# Patient Record
Sex: Male | Born: 1970
Health system: Southern US, Community
[De-identification: ages and names within clinical notes are randomized; demographics above are authoritative.]

## PROBLEM LIST (undated history)

## (undated) DIAGNOSIS — R42 Dizziness and giddiness: Secondary | ICD-10-CM

## (undated) DIAGNOSIS — N529 Male erectile dysfunction, unspecified: Secondary | ICD-10-CM

## (undated) DIAGNOSIS — F419 Anxiety disorder, unspecified: Secondary | ICD-10-CM

## (undated) DIAGNOSIS — R251 Tremor, unspecified: Secondary | ICD-10-CM

## (undated) DIAGNOSIS — K219 Gastro-esophageal reflux disease without esophagitis: Secondary | ICD-10-CM

## (undated) HISTORY — DX: Dizziness and giddiness: R42

## (undated) HISTORY — DX: Gastro-esophageal reflux disease without esophagitis: K21.9

## (undated) HISTORY — DX: Tremor, unspecified: R25.1

## (undated) HISTORY — DX: Anxiety disorder, unspecified: F41.9

## (undated) HISTORY — PX: UPPER GASTROINTESTINAL ENDOSCOPY: SHX188

## (undated) HISTORY — DX: Male erectile dysfunction, unspecified: N52.9

---
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
## 1988-08-09 HISTORY — PX: INGUINAL HERNIA REPAIR: SUR1180

## 2001-02-14 ENCOUNTER — Emergency Department (HOSPITAL_COMMUNITY): Admission: EM | Admit: 2001-02-14 | Discharge: 2001-02-14 | Payer: Self-pay | Admitting: Emergency Medicine

## 2001-02-20 ENCOUNTER — Ambulatory Visit (HOSPITAL_COMMUNITY): Admission: RE | Admit: 2001-02-20 | Discharge: 2001-02-20 | Payer: Self-pay | Admitting: Internal Medicine

## 2004-02-26 ENCOUNTER — Emergency Department (HOSPITAL_COMMUNITY): Admission: EM | Admit: 2004-02-26 | Discharge: 2004-02-27 | Payer: Self-pay | Admitting: Emergency Medicine

## 2005-06-14 ENCOUNTER — Emergency Department (HOSPITAL_COMMUNITY): Admission: EM | Admit: 2005-06-14 | Discharge: 2005-06-14 | Payer: Self-pay
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
## 2018-06-12 DIAGNOSIS — Z125 Encounter for screening for malignant neoplasm of prostate: Secondary | ICD-10-CM | POA: Diagnosis not present

## 2018-06-12 DIAGNOSIS — N451 Epididymitis: Secondary | ICD-10-CM | POA: Diagnosis not present

## 2018-06-12 DIAGNOSIS — Z131 Encounter for screening for diabetes mellitus: Secondary | ICD-10-CM | POA: Diagnosis not present

## 2018-06-12 DIAGNOSIS — Z113 Encounter for screening for infections with a predominantly sexual mode of transmission: Secondary | ICD-10-CM | POA: Diagnosis not present

## 2018-06-12 DIAGNOSIS — E878 Other disorders of electrolyte and fluid balance, not elsewhere classified: Secondary | ICD-10-CM | POA: Diagnosis not present

## 2018-06-12 DIAGNOSIS — R634 Abnormal weight loss: Secondary | ICD-10-CM | POA: Diagnosis not present

## 2018-06-12 DIAGNOSIS — Z13220 Encounter for screening for lipoid disorders: Secondary | ICD-10-CM | POA: Diagnosis not present

## 2018-06-12 DIAGNOSIS — N401 Enlarged prostate with lower urinary tract symptoms: Secondary | ICD-10-CM | POA: Diagnosis not present

## 2018-06-12 DIAGNOSIS — R3912 Poor urinary stream: Secondary | ICD-10-CM | POA: Diagnosis not present

## 2018-06-26 DIAGNOSIS — E291 Testicular hypofunction: Secondary | ICD-10-CM | POA: Diagnosis not present

## 2018-06-26 DIAGNOSIS — K210 Gastro-esophageal reflux disease with esophagitis: Secondary | ICD-10-CM | POA: Diagnosis not present

## 2018-06-26 DIAGNOSIS — Z0001 Encounter for general adult medical examination with abnormal findings: Secondary | ICD-10-CM | POA: Diagnosis not present

## 2018-06-26 DIAGNOSIS — E782 Mixed hyperlipidemia: Secondary | ICD-10-CM | POA: Diagnosis not present

## 2018-10-11 DIAGNOSIS — K210 Gastro-esophageal reflux disease with esophagitis: Secondary | ICD-10-CM | POA: Diagnosis not present

## 2018-10-11 DIAGNOSIS — E782 Mixed hyperlipidemia: Secondary | ICD-10-CM | POA: Diagnosis not present

## 2018-10-11 DIAGNOSIS — R7303 Prediabetes: Secondary | ICD-10-CM | POA: Diagnosis not present

## 2018-10-11 DIAGNOSIS — E291 Testicular hypofunction: Secondary | ICD-10-CM | POA: Diagnosis not present

## 2018-10-16 DIAGNOSIS — E782 Mixed hyperlipidemia: Secondary | ICD-10-CM | POA: Diagnosis not present

## 2018-10-16 DIAGNOSIS — K210 Gastro-esophageal reflux disease with esophagitis: Secondary | ICD-10-CM | POA: Diagnosis not present

## 2018-10-16 DIAGNOSIS — E291 Testicular hypofunction: Secondary | ICD-10-CM | POA: Diagnosis not present

## 2018-10-16 DIAGNOSIS — R7303 Prediabetes: Secondary | ICD-10-CM | POA: Diagnosis not present

## 2019-01-22 DIAGNOSIS — R7301 Impaired fasting glucose: Secondary | ICD-10-CM | POA: Diagnosis not present

## 2019-01-22 DIAGNOSIS — N529 Male erectile dysfunction, unspecified: Secondary | ICD-10-CM | POA: Diagnosis not present

## 2019-01-22 DIAGNOSIS — E291 Testicular hypofunction: Secondary | ICD-10-CM | POA: Diagnosis not present

## 2019-01-22 DIAGNOSIS — E782 Mixed hyperlipidemia: Secondary | ICD-10-CM | POA: Diagnosis not present

## 2019-01-22 DIAGNOSIS — R7303 Prediabetes: Secondary | ICD-10-CM | POA: Diagnosis not present

## 2019-01-29 ENCOUNTER — Ambulatory Visit (HOSPITAL_COMMUNITY)
Admission: RE | Admit: 2019-01-29 | Discharge: 2019-01-29 | Disposition: A | Discharge status: Home / Self Care | Payer: BC Managed Care – PPO | Source: Ambulatory Visit | Attending: Nurse Practitioner | Admitting: Nurse Practitioner

## 2019-01-29 ENCOUNTER — Other Ambulatory Visit (HOSPITAL_COMMUNITY): Payer: Self-pay | Admitting: Nurse Practitioner

## 2019-01-29 ENCOUNTER — Other Ambulatory Visit: Payer: Self-pay

## 2019-01-29 DIAGNOSIS — Z0001 Encounter for general adult medical examination with abnormal findings: Secondary | ICD-10-CM | POA: Diagnosis not present

## 2019-01-29 DIAGNOSIS — R7303 Prediabetes: Secondary | ICD-10-CM | POA: Diagnosis not present

## 2019-01-29 DIAGNOSIS — M25512 Pain in left shoulder: Secondary | ICD-10-CM

## 2019-01-29 DIAGNOSIS — L918 Other hypertrophic disorders of the skin: Secondary | ICD-10-CM | POA: Diagnosis not present

## 2019-01-29 DIAGNOSIS — E782 Mixed hyperlipidemia: Secondary | ICD-10-CM | POA: Diagnosis not present

## 2019-01-29 DIAGNOSIS — R7301 Impaired fasting glucose: Secondary | ICD-10-CM | POA: Diagnosis not present

## 2019-04-04 DIAGNOSIS — H6091 Unspecified otitis externa, right ear: Secondary | ICD-10-CM | POA: Diagnosis not present

## 2019-05-02 DIAGNOSIS — E782 Mixed hyperlipidemia: Secondary | ICD-10-CM | POA: Diagnosis not present

## 2019-05-02 DIAGNOSIS — R7303 Prediabetes: Secondary | ICD-10-CM | POA: Diagnosis not present

## 2019-05-02 DIAGNOSIS — R7301 Impaired fasting glucose: Secondary | ICD-10-CM | POA: Diagnosis not present

## 2019-05-10 DIAGNOSIS — R7303 Prediabetes: Secondary | ICD-10-CM | POA: Diagnosis not present

## 2019-05-10 DIAGNOSIS — E291 Testicular hypofunction: Secondary | ICD-10-CM | POA: Diagnosis not present

## 2019-05-10 DIAGNOSIS — R7301 Impaired fasting glucose: Secondary | ICD-10-CM | POA: Diagnosis not present

## 2019-05-10 DIAGNOSIS — E782 Mixed hyperlipidemia: Secondary | ICD-10-CM | POA: Diagnosis not present

## 2019-10-25 DIAGNOSIS — R42 Dizziness and giddiness: Secondary | ICD-10-CM | POA: Diagnosis not present

## 2019-10-25 DIAGNOSIS — R251 Tremor, unspecified: Secondary | ICD-10-CM | POA: Diagnosis not present

## 2019-11-08 DIAGNOSIS — R7303 Prediabetes: Secondary | ICD-10-CM | POA: Diagnosis not present

## 2019-11-08 DIAGNOSIS — Z8249 Family history of ischemic heart disease and other diseases of the circulatory system: Secondary | ICD-10-CM | POA: Diagnosis not present

## 2019-11-08 DIAGNOSIS — H6091 Unspecified otitis externa, right ear: Secondary | ICD-10-CM | POA: Diagnosis not present

## 2019-11-08 DIAGNOSIS — Z712 Person consulting for explanation of examination or test findings: Secondary | ICD-10-CM | POA: Diagnosis not present

## 2019-11-19 DIAGNOSIS — E782 Mixed hyperlipidemia: Secondary | ICD-10-CM | POA: Diagnosis not present

## 2019-11-19 DIAGNOSIS — E559 Vitamin D deficiency, unspecified: Secondary | ICD-10-CM | POA: Diagnosis not present

## 2019-11-19 DIAGNOSIS — R7301 Impaired fasting glucose: Secondary | ICD-10-CM | POA: Diagnosis not present

## 2019-11-19 DIAGNOSIS — R7303 Prediabetes: Secondary | ICD-10-CM | POA: Diagnosis not present

## 2019-12-05 ENCOUNTER — Encounter: Payer: Self-pay | Admitting: *Deleted

## 2019-12-06 ENCOUNTER — Encounter: Payer: Self-pay | Admitting: Neurology

## 2019-12-06 ENCOUNTER — Other Ambulatory Visit: Payer: Self-pay

## 2019-12-06 ENCOUNTER — Ambulatory Visit: Payer: BC Managed Care – PPO | Admitting: Neurology

## 2019-12-06 VITALS — BP 138/84 | HR 90 | Temp 97.6°F | Ht 72.0 in | Wt 164.0 lb

## 2019-12-06 DIAGNOSIS — E539 Vitamin B deficiency, unspecified: Secondary | ICD-10-CM | POA: Diagnosis not present

## 2019-12-06 DIAGNOSIS — G909 Disorder of the autonomic nervous system, unspecified: Secondary | ICD-10-CM

## 2019-12-06 DIAGNOSIS — R799 Abnormal finding of blood chemistry, unspecified: Secondary | ICD-10-CM | POA: Diagnosis not present

## 2019-12-06 DIAGNOSIS — R7989 Other specified abnormal findings of blood chemistry: Secondary | ICD-10-CM | POA: Diagnosis not present

## 2019-12-06 DIAGNOSIS — E559 Vitamin D deficiency, unspecified: Secondary | ICD-10-CM | POA: Diagnosis not present

## 2019-12-06 MED ORDER — PYRIDOSTIGMINE BROMIDE 60 MG PO TABS: 60.0000 mg | ORAL_TABLET | Freq: Three times a day (TID) | ORAL | 6 refills | Status: DC

## 2019-12-06 NOTE — Progress Notes (Signed)
PATIENT: Joshua Brennan DOB: [birth year]  Chief Complaint  Patient presents with  . Tremors    He is here to have his left hand tremor evaluated.  [name] PCP    [name], MD     HISTORICAL  Joshua Brennan is a 49 year old male, seen in request by his primary care physician Dr. [name], [name] for evaluation of dizziness, he is accompanied by his significant other is no real at today's visit on December 06, 2019.  I have reviewed and summarized the referring note from the referring physician.  He was previously healthy, works as a Transport planner, third shift for 14 years with irregular sleeping patterns, from 7 PM, to 7 AM, his job require frequent body twisting back-and-forth, occasionally heavy lifting  In 2020, he noticed gradual onset dizziness, only noticed dizziness in a standing position, especially when he first got up after prolonged sitting, he denies symptoms when lying down or sitting down, sometimes he felt so dizzy, he lost his balance, he denies significant difficulty perform his job.  He also describe worsening mild constipation, taking Viagra as needed  He denies bilateral lower extremity paresthesia, denies weakness, denies bowel and bladder incontinence, he was giving meclizine as needed, which was not helpful  On today's examination, he was found to have elevated pulse rate even resting, heart rate was 93, blood pressure 138/80, when he standing up, blood pressure was 140/28, heart rate was 103  He denied loss sense of smell, he used to be energetic only after few hours sleep, now he spent a lot of time sleeping, feels fatigued after waking up, denies REM sleep disorder  REVIEW OF SYSTEMS: Full 14 system review of systems performed and notable only for as above All other review of systems were negative.  ALLERGIES: No Known Allergies  HOME MEDICATIONS: Current Outpatient Medications  Medication Sig Dispense Refill  . meclizine (ANTIVERT) 25 MG tablet Take 25 mg by  mouth every 6 (six) hours as needed for dizziness.    . sildenafil (VIAGRA) 50 MG tablet Take 50 mg by mouth daily as needed for erectile dysfunction.     No current facility-administered medications for this visit.    PAST MEDICAL HISTORY: Past Medical History:  Diagnosis Date  . Dizziness   . Tremor     PAST SURGICAL HISTORY:   FAMILY HISTORY: No family history on file.  SOCIAL HISTORY: Social History   Socioeconomic History  . Marital status: Married    Spouse name: Not on file  . Number of children: Not on file  . Years of education: Not on file  . Highest education level: Not on file  Occupational History  . Not on file  Tobacco Use  . Smoking status: Current Every Day Smoker  Substance and Sexual Activity  . Alcohol use: Not on file  . Drug use: Not on file  . Sexual activity: Not on file  Other Topics Concern  . Not on file  Social History Narrative   Lives   Caffeine use:    Social Determinants of Health   Financial Resource Strain:   . Difficulty of Paying Living Expenses:   Food Insecurity:   . Worried About Programme researcher, broadcasting/film/video in the Last Year:   . Barista in the Last Year:   Transportation Needs:   . Freight forwarder (Medical):   [name] Lack of Transportation (Non-Medical):   Physical Activity:   . Days of Exercise per  Week:   . Minutes of Exercise per Session:   Stress:   . Feeling of Stress :   Social Connections:   . Frequency of Communication with Friends and Family:   . Frequency of Social Gatherings with Friends and Family:   . Attends Religious Services:   . Active Member of Clubs or Organizations:   . Attends Archivist Meetings:   [name] Marital Status:   Intimate Partner Violence:   . Fear of Current or Ex-Partner:   . Emotionally Abused:   [name] Physically Abused:   . Sexually Abused:      PHYSICAL EXAM   There were no vitals filed for this visit.  Not recorded     Sitting down blood pressure 138/78, heart  rate of 93, standing up 140/80, heart rate of 103 There is no height or weight on file to calculate BMI.  PHYSICAL EXAMNIATION:  Gen: NAD, conversant, well nourised, well groomed                     Cardiovascular: Regular rate rhythm, no peripheral edema, warm, nontender. Eyes: Conjunctivae clear without exudates or hemorrhage Neck: Supple, no carotid bruits. Pulmonary: Clear to auscultation bilaterally   NEUROLOGICAL EXAM:  MENTAL STATUS: Speech:    Speech is normal; fluent and spontaneous with normal comprehension.  Cognition:     Orientation to time, place and person     Normal recent and remote memory     Normal Attention span and concentration     Normal Language, naming, repeating,spontaneous speech     Fund of knowledge   CRANIAL NERVES: CN II: Visual fields are full to confrontation. Pupils are round equal and briskly reactive to light. CN III, IV, VI: extraocular movement are normal. No ptosis. CN V: Facial sensation is intact to light touch CN VII: Face is symmetric with normal eye closure  CN VIII: Hearing is normal to causal conversation. CN IX, X: Phonation is normal. CN XI: Head turning and shoulder shrug are intact  MOTOR:  Muscle bulk and he has mild bradykinesia of left upper extremity, also limited due to his left shoulder pain, and left foot tapping, muscle strength is normal.  REFLEXES: Reflexes are 2+ and symmetric at the biceps, triceps, knees, and ankles. Plantar responses are flexor.  SENSORY: Intact to light touch, pinprick and vibratory sensation are intact in fingers and toes.  COORDINATION: There is no trunk or limb dysmetria noted.  GAIT/STANCE: Posture is normal. Gait is steady with normal steps, base, arm swing, and turning. Heel and toe walking are normal. Tandem gait is normal.  Romberg is absent.   DIAGNOSTIC DATA (LABS, IMAGING, TESTING) - I reviewed patient records, labs, notes, testing and imaging myself where  available.   ASSESSMENT AND PLAN  Joshua Brennan is a 49 y.o. male   Subacute onset of positional related dizziness,   History also suggest associated mild autonomic symptoms, worsening constipation, incontinence,  There is evidence of mild orthostatic tachycardia,  Mild bradykinesia of left hand, left foot  Differentiation diagnosis include autonomic system insufficiency, early stage of central nervous system degenerative disorder.  MRI brain  Laboratory evaluations  EMG nerve conduction study  Empiric treatment with Mestinon up to 60 mg 3 times a day    Marcial Pacas, M.D. Ph.D.  [facility] [address], [place], [place] [address] Ph: [phone] Fax: [phone]  CC: Celene Squibb, MD

## 2019-12-10 ENCOUNTER — Telehealth: Payer: Self-pay | Admitting: Neurology

## 2019-12-10 LAB — COMPREHENSIVE METABOLIC PANEL
ALT: 10 IU/L (ref 0–44)
AST: 13 IU/L (ref 0–40)
Albumin/Globulin Ratio: 1.7 (ref 1.2–2.2)
Albumin: 4.3 g/dL (ref 3.8–4.9)
Alkaline Phosphatase: 121 IU/L — ABNORMAL HIGH (ref 39–117)
BUN/Creatinine Ratio: 11 (ref 9–20)
BUN: 12 mg/dL (ref 6–24)
Bilirubin Total: 0.2 mg/dL (ref 0.0–1.2)
CO2: 24 mmol/L (ref 20–29)
Calcium: 9.3 mg/dL (ref 8.7–10.2)
Chloride: 106 mmol/L (ref 96–106)
Creatinine, Ser: 1.09 mg/dL (ref 0.76–1.27)
GFR calc Af Amer: 90 mL/min/{1.73_m2} (ref >59)
GFR calc non Af Amer: 78 mL/min/{1.73_m2} (ref >59)
Globulin, Total: 2.6 g/dL (ref 1.5–4.5)
Glucose: 92 mg/dL (ref 65–99)
Potassium: 4.1 mmol/L (ref 3.5–5.2)
Sodium: 142 mmol/L (ref 134–144)
Total Protein: 6.9 g/dL (ref 6.0–8.5)

## 2019-12-10 LAB — MULTIPLE MYELOMA PANEL, SERUM
Albumin SerPl Elph-Mcnc: 3.9 g/dL (ref 2.9–4.4)
Albumin/Glob SerPl: 1.4 (ref 0.7–1.7)
Alpha 1: 0.2 g/dL (ref 0.0–0.4)
Alpha2 Glob SerPl Elph-Mcnc: 0.7 g/dL (ref 0.4–1.0)
B-Globulin SerPl Elph-Mcnc: 1.2 g/dL (ref 0.7–1.3)
Gamma Glob SerPl Elph-Mcnc: 0.9 g/dL (ref 0.4–1.8)
Globulin, Total: 3 g/dL (ref 2.2–3.9)
IgA/Immunoglobulin A, Serum: 165 mg/dL (ref 90–386)
IgG (Immunoglobin G), Serum: 1086 mg/dL (ref 603–1613)
IgM (Immunoglobulin M), Srm: 53 mg/dL (ref 20–172)

## 2019-12-10 LAB — CBC WITH DIFFERENTIAL
Basophils Absolute: 0 10*3/uL (ref 0.0–0.2)
Basos: 0 %
EOS (ABSOLUTE): 0.1 10*3/uL (ref 0.0–0.4)
Eos: 1 %
Hematocrit: 45.6 % (ref 37.5–51.0)
Hemoglobin: 15.3 g/dL (ref 13.0–17.7)
Immature Grans (Abs): 0 10*3/uL (ref 0.0–0.1)
Immature Granulocytes: 0 %
Lymphocytes Absolute: 2.3 10*3/uL (ref 0.7–3.1)
Lymphs: 25 %
MCH: 27.9 pg (ref 26.6–33.0)
MCHC: 33.6 g/dL (ref 31.5–35.7)
MCV: 83 fL (ref 79–97)
Monocytes Absolute: 0.8 10*3/uL (ref 0.1–0.9)
Monocytes: 8 %
Neutrophils Absolute: 6.1 10*3/uL (ref 1.4–7.0)
Neutrophils: 66 %
RBC: 5.49 x10E6/uL (ref 4.14–5.80)
RDW: 12.9 % (ref 11.6–15.4)
WBC: 9.3 10*3/uL (ref 3.4–10.8)

## 2019-12-10 LAB — HIV ANTIBODY (ROUTINE TESTING W REFLEX): HIV Screen 4th Generation wRfx: NONREACTIVE

## 2019-12-10 LAB — VITAMIN B12: Vitamin B-12: 595 pg/mL (ref 232–1245)

## 2019-12-10 LAB — VITAMIN D 25 HYDROXY (VIT D DEFICIENCY, FRACTURES): Vit D, 25-Hydroxy: 25.9 ng/mL — ABNORMAL LOW (ref 30.0–100.0)

## 2019-12-10 LAB — SJOGRENS SYNDROME-B EXTRACTABLE NUCLEAR ANTIBODY: ENA SSB (LA) Ab: <0.2 AI (ref 0.0–0.9)

## 2019-12-10 LAB — SEDIMENTATION RATE: Sed Rate: 3 mm/hr (ref 0–30)

## 2019-12-10 LAB — C-REACTIVE PROTEIN: CRP: <1 mg/L (ref 0–10)

## 2019-12-10 LAB — ANA W/REFLEX IF POSITIVE: Anti Nuclear Antibody (ANA): NEGATIVE

## 2019-12-10 LAB — FOLATE: Folate: 5.8 ng/mL (ref >3.0)

## 2019-12-10 LAB — THYROID PANEL WITH TSH
Free Thyroxine Index: 2.3 (ref 1.2–4.9)
T3 Uptake Ratio: 29 % (ref 24–39)
T4, Total: 8 ug/dL (ref 4.5–12.0)
TSH: 1.37 u[IU]/mL (ref 0.450–4.500)

## 2019-12-10 LAB — SJOGRENS SYNDROME-A EXTRACTABLE NUCLEAR ANTIBODY: ENA SSA (RO) Ab: <0.2 AI (ref 0.0–0.9)

## 2019-12-10 LAB — HGB A1C W/O EAG: Hgb A1c MFr Bld: 5.6 % (ref 4.8–5.6)

## 2019-12-10 LAB — CK: Total CK: 95 U/L (ref 41–331)

## 2019-12-10 LAB — RPR: RPR Ser Ql: NONREACTIVE

## 2019-12-10 NOTE — Telephone Encounter (Signed)
Please call patient, extensive laboratory evaluation showed mildly decreased vitamin D 26, he would benefit vitamin D3 supplement, 1000 units daily CMP showed mild elevated alkaline phosphate, that has unknown clinical significance, rest of the laboratory evaluations were normal.

## 2019-12-10 NOTE — Telephone Encounter (Signed)
Left message requesting a call back.

## 2019-12-11 NOTE — Telephone Encounter (Signed)
Left second message requesting a call back. 

## 2019-12-11 NOTE — Telephone Encounter (Signed)
I spoke to the patient and he verbalized understanding of his lab results. He is agreeable to start the recommended vitamin D supplement.  I was able to schedule his NCV/EMG while on the phone.

## 2019-12-13 ENCOUNTER — Telehealth: Payer: Self-pay | Admitting: Neurology

## 2019-12-13 NOTE — Telephone Encounter (Signed)
LVM for pt to call back about scheduilng mri  BC[employer] Auth: [id number] (exp. 12/12/19 to 01/10/20)

## 2019-12-14 NOTE — Telephone Encounter (Signed)
Pt returned call. Please call back when available. 

## 2019-12-17 NOTE — Telephone Encounter (Signed)
I left a voicemail for patient to call back

## 2019-12-18 NOTE — Telephone Encounter (Signed)
Pt is asking for a call back from [place]

## 2019-12-18 NOTE — Telephone Encounter (Signed)
I spoke with the patient he is scheduled at [facility] for 12/19/19.

## 2019-12-19 ENCOUNTER — Other Ambulatory Visit: Payer: Self-pay

## 2019-12-19 ENCOUNTER — Ambulatory Visit: Payer: BC Managed Care – PPO

## 2019-12-19 DIAGNOSIS — G909 Disorder of the autonomic nervous system, unspecified: Secondary | ICD-10-CM | POA: Diagnosis not present

## 2019-12-19 DIAGNOSIS — R42 Dizziness and giddiness: Secondary | ICD-10-CM | POA: Diagnosis not present

## 2019-12-19 MED ORDER — GADOBENATE DIMEGLUMINE 529 MG/ML IV SOLN
15.0000 mL | Freq: Once | INTRAVENOUS | Status: AC | PRN
  Administered 2019-12-19: 15 mL via INTRAVENOUS

## 2019-12-21 ENCOUNTER — Telehealth: Payer: Self-pay

## 2019-12-21 NOTE — Telephone Encounter (Signed)
-----   Message from Yijun Yan, MD sent at 12/21/2019  8:46 AM EDT ----- Please call pt for normal MRI brain. 

## 2019-12-21 NOTE — Telephone Encounter (Signed)
I called pt to discuss. No answer, left a message asking him to call me back. 

## 2019-12-24 ENCOUNTER — Telehealth: Payer: Self-pay | Admitting: *Deleted

## 2019-12-24 NOTE — Telephone Encounter (Signed)
I spoke to the patient and notified him of the MRI results below.

## 2019-12-24 NOTE — Telephone Encounter (Signed)
-----   Message from Levert Feinstein, MD sent at 12/21/2019  8:46 AM EDT ----- Please call pt for normal MRI brain.

## 2020-01-02 ENCOUNTER — Telehealth: Payer: Self-pay | Admitting: Neurology

## 2020-01-02 ENCOUNTER — Ambulatory Visit: Payer: BC Managed Care – PPO | Admitting: Neurology

## 2020-01-02 ENCOUNTER — Other Ambulatory Visit: Payer: Self-pay

## 2020-01-02 ENCOUNTER — Ambulatory Visit (INDEPENDENT_AMBULATORY_CARE_PROVIDER_SITE_OTHER): Payer: BC Managed Care – PPO | Admitting: Neurology

## 2020-01-02 DIAGNOSIS — G909 Disorder of the autonomic nervous system, unspecified: Secondary | ICD-10-CM | POA: Diagnosis not present

## 2020-01-02 DIAGNOSIS — R202 Paresthesia of skin: Secondary | ICD-10-CM | POA: Diagnosis not present

## 2020-01-02 DIAGNOSIS — G5622 Lesion of ulnar nerve, left upper limb: Secondary | ICD-10-CM

## 2020-01-02 DIAGNOSIS — F329 Major depressive disorder, single episode, unspecified: Secondary | ICD-10-CM | POA: Diagnosis not present

## 2020-01-02 DIAGNOSIS — F32A Depression, unspecified: Secondary | ICD-10-CM

## 2020-01-02 NOTE — Progress Notes (Signed)
PATIENT: Joshua Brennan DOB: 01/10/1971  No chief complaint on file.    HISTORICAL  Joshua Brennan is a 49 year old male, seen in request by his primary care physician Dr. Nevada Crane, Jenny Reichmann for evaluation of dizziness, he is accompanied by his significant others at today's visit on December 06, 2019.  I have reviewed and summarized the referring note from the referring physician.  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  He also describe worsening mild constipation, taking Viagra as needed  He denies bilateral lower extremity paresthesia, denies weakness, denies bowel and bladder incontinence, he was giving meclizine as needed, which was not helpful  On today's examination, he was found to have elevated pulse rate even resting, heart rate was 93, blood pressure 138/80, when he standing up, blood pressure was 140/28, heart rate was 103  He denied loss sense of smell, he used to be energetic only after few hours sleep, now he spent a lot of time sleeping, feels fatigued after waking up, denies REM sleep disorder  UPDATE Jan 02 2020: He return for electrodiagnostic study today, which showed mild left ulnar neuropathy, no evidence of large fiber peripheral neuropathy, or left cervical radiculopathy.  He also complains of fatigue, lack of energy, both himself and his significant can others confirm that his suffering depression,  He has tried Mestinon, complains of bloating, did not take this morning,  Lying down blood pressure  119/74, heart rate of 69, sitting up 144/75 heart rate of 70, standing up 132/84 heart rate of 85, standing up for 1 minutes 120/82 heart rate of 90  I personally reviewed MRI of the brain with without contrast on 12/22/2019 that was normal  Extensive laboratory evaluation on 12/06/2019 showed normal A1c 5.6, B12 595, C-reactive protein, ESR, CPK, thyroid functional test, ANA, SSA, B, immunofixation protein electrophoresis, CBC, negative RPR, CMP, HIV, mildly low vitamin D 26,  REVIEW OF SYSTEMS: Full 14 system review of systems performed and notable only for as above All other review of systems were negative.  ALLERGIES: No Known Allergies  HOME MEDICATIONS: Current Outpatient Medications  Medication Sig Dispense Refill  . Cholecalciferol (VITAMIN D3 PO) Take 1,000 Units by mouth daily.    [name] ibuprofen (ADVIL) 600 MG tablet Take 600 mg by mouth 4 (four) times daily as needed.    . meclizine (ANTIVERT) 25 MG tablet Take 25 mg by mouth every 6 (six) hours as needed for dizziness.    . pyridostigmine (MESTINON) 60 MG tablet Take 1 tablet (60 mg total) by mouth 3 (three) times daily. 90 tablet 6  . sildenafil (VIAGRA) 50 MG tablet Take 50 mg by mouth daily as needed for erectile dysfunction.     No current facility-administered medications for this visit.    PAST MEDICAL HISTORY: Past Medical History:  Diagnosis Date  . Anxiety   . Dizziness   . Tremor     PAST SURGICAL HISTORY:   FAMILY HISTORY: Family History  Problem Relation Age of Onset  . Diabetes Mother   . Pneumonia Father  SOCIAL HISTORY: Social History   Socioeconomic History  . Marital status: Married    Spouse name: Not on file  . Number of children: 2  . Years of education: some college  . Highest education level: Not on file  Occupational History  . Occupation: Designer, fashion/clothing  Tobacco Use  . Smoking status: Current Every Day Smoker    Packs/day: 1.00    Types: Cigarettes  . Smokeless tobacco: Never  Used  Substance and Sexual Activity  . Alcohol use: Not Currently  . Drug use: Yes    Types: Marijuana    Comment: sometimes  . Sexual activity: Not on file  Other Topics Concern  . Not on file  Social History Narrative   Left handed.   Lives at home with significant other.   He drinks 8-10 cans of double espresso cold coffee each week.   Social Determinants of Health   Financial Resource Strain:   . Difficulty of Paying Living Expenses:   Food Insecurity:   . Worried About Charity fundraiser in the Last Year:   . Arboriculturist in the Last Year:   Transportation Needs:   . Film/video editor (Medical):   [name] Lack of Transportation (Non-Medical):   Physical Activity:   . Days of Exercise per Week:   . Minutes of Exercise per Session:   Stress:   . Feeling of Stress :   Social Connections:   . Frequency of Communication with Friends and Family:   . Frequency of Social Gatherings with Friends and Family:   . Attends Religious Services:   . Active Member of Clubs or Organizations:   . Attends Archivist Meetings:   [name] Marital Status:   Intimate Partner Violence:   . Fear of Current or Ex-Partner:   . Emotionally Abused:   [name] Physically Abused:   . Sexually Abused:      PHYSICAL EXAM   There were no vitals filed for this visit.  Not recorded     Sitting down blood pressure 138/78, heart rate of 93, standing up 140/80, heart rate of 103 There is no height or weight on file to calculate BMI.  PHYSICAL EXAMNIATION:  Gen: NAD, conversant, well nourised, well groomed                     Cardiovascular: Regular rate rhythm, no peripheral edema, warm, nontender. Eyes: Conjunctivae clear without exudates or hemorrhage Neck: Supple, no carotid bruits. Pulmonary: Clear to auscultation bilaterally   NEUROLOGICAL EXAM:  MENTAL STATUS: Speech:    Speech is normal; fluent and spontaneous with normal comprehension.  Cognition:     Orientation to time, place  and person     Normal recent and remote memory     Normal Attention span and concentration     Normal Language, naming, repeating,spontaneous speech     Fund of knowledge   CRANIAL NERVES: CN II: Visual fields are full to confrontation. Pupils are round equal and briskly reactive to light. CN III, IV, VI: extraocular movement are normal. No ptosis. CN V: Facial sensation is intact to light touch CN VII: Face is symmetric with normal eye closure  CN VIII: Hearing is normal to causal conversation. CN IX, X: Phonation is normal. CN XI: Head turning and shoulder shrug are intact  MOTOR: Limitation of left shoulder examination due to left shoulder pain, there was no weakness of bilateral upper extremity or lower extremity noted, there was no  bradykinesia noted.  REFLEXES: Reflexes are 2+ and symmetric at the biceps, triceps, knees, and ankles. Plantar responses are flexor.  SENSORY: Intact to light touch, pinprick and vibratory sensation are intact in fingers and toes.  COORDINATION: There is no trunk or limb dysmetria noted.  GAIT/STANCE: He can get up from seated position arms crossed, steady  DIAGNOSTIC DATA (LABS, IMAGING, TESTING) - I reviewed patient records, labs, notes, testing and imaging myself where available.   ASSESSMENT AND PLAN  Joshua Brennan is a 49 y.o. male   Constellation of complaints,  Including poor sleep quality, dizziness, fatigue,  Himself and his significant others to realize that he is suffering depression,  Extensive neurological evaluation showed normal MRI of the brain, extensive laboratory evaluation only showed mild vitamin D deficiency,  He will continue follow-up with his primary care for treatment of depression  Left ulnar neuropathy  Mild, demyelinating, left elbow sensitivity,    Marcial Pacas, M.D. Ph.D.  [facility] [address] [place], [place] [address] Ph: [phone] Fax: [phone]  CC: Celene Squibb, MD

## 2020-01-02 NOTE — Telephone Encounter (Signed)
Open in error

## 2020-01-02 NOTE — Procedures (Signed)
Full Name: Joshua Brennan Gender: Male MRN #: [id number] Date of Birth: 1971/05/21    Visit Date: 01/02/2020 09:13 Age: 49 Years Examining Physician: Levert Feinstein, MD  Referring Physician: Levert Feinstein, MD Height: 6 feet 0 inch History:   49 year old male complains of fatigue, left shoulder pain, left hand paresthesia mainly involving left fourth and fifth fingers.  Summary of the tests:  Nerve conduction study: Left ulnar sensory response showed mildly decreased snap amplitude, left ulnar motor responses showed 12 m/s velocity drop across left elbow. Left sural, superficial peroneal, median, radial sensory responses were normal.  Left tibial, peroneal to EDB, median motor responses were normal  Electromyography: Selected needle examination of left lower extremity muscles, left upper extremity muscles and left cervical paraspinal muscles showed no significant abnormality, he has mild decreased recruitment patterns noted at the left abductor digital minimum.  Conclusion: This is a mild abnormal study.  There is electrodiagnostic evidence of mild left ulnar neuropathy across the left elbow, demyelinating, no evidence of axonal loss.  There is no evidence of large fiber peripheral neuropathy, left cervical radiculopathy.    ------------------------------- Levert Feinstein, M.D. PhD  [facility] [address], [address] [place], [place] [address] Tel: [phone] Fax: [phone]  Verbal informed consent was obtained from the patient, patient was informed of potential risk of procedure, including bruising, bleeding, hematoma formation, infection, muscle weakness, muscle pain, numbness, among others.        MNC    Nerve / Sites Muscle Latency Ref. Amplitude Ref. Rel Amp Segments Distance Velocity Ref. Area    ms ms mV mV %  cm m/s m/s mVms  L Median - APB     Wrist APB 3.7 ?4.4 5.6 ?4.0 100 Wrist - APB 7   18.8     Upper arm APB 8.1  5.2  91.7 Upper arm - Wrist 25  56 ?49 19.2  L Ulnar - ADM     Wrist ADM 2.8 ?3.3 8.0 ?6.0 100 Wrist - ADM 7   32.9     B.Elbow ADM 7.8  7.6  94.7 B.Elbow - Wrist 25 51 ?49 32.2     A.Elbow ADM 10.3  7.0  92.9 A.Elbow - B.Elbow 10 39 ?49 31.9  L Peroneal - EDB     Ankle EDB 5.4 ?6.5 5.0 ?2.0 100 Ankle - EDB 9   14.8     Fib head EDB 13.0  4.5  91 Fib head - Ankle 33 44 ?44 13.9     Pop fossa EDB 15.3  4.4  97.3 Pop fossa - Fib head 10 44 ?44 13.7         Pop fossa - Ankle      L Tibial - AH     Ankle AH 4.2 ?5.8 10.9 ?4.0 100 Ankle - AH 9   20.2     Pop fossa AH 15.2  8.2  75.4 Pop fossa - Ankle 45 41 ?41 20.3             SNC    Nerve / Sites Rec. Site Peak Lat Ref.  Amp Ref. Segments Distance    ms ms V V  cm  L Radial - Anatomical snuff box (Forearm)     Forearm Wrist 2.5 ?2.9 38 ?15 Forearm - Wrist 10  L Sural - Ankle (Calf)     Calf Ankle 3.6 ?4.4 24 ?6 Calf - Ankle 14  L Superficial peroneal - Ankle  Lat leg Ankle 4.3 ?4.4 12 ?6 Lat leg - Ankle 14  L Median - Orthodromic (Dig II, Mid palm)     Dig II Wrist 2.9 ?3.4 12 ?10 Dig II - Wrist 13  L Ulnar - Orthodromic, (Dig V, Mid palm)     Dig V Wrist 2.8 ?3.1 2 ?5 Dig V - Wrist 1               F  Wave    Nerve F Lat Ref.   ms ms  L Tibial - AH 58.5 ?56.0  L Ulnar - ADM 34.8 ?32.0         EMG Summary Table    Spontaneous MUAP Recruitment  Muscle IA Fib PSW Fasc Other Amp Dur. Poly Pattern  L. First dorsal interosseous Normal None None None _______ Normal Normal Normal Normal  L. Abductor pollicis brevis Normal None None None _______ Normal Normal Normal Reduced  L. Flexor digitorum profundus (Ulnar) Normal None None None _______ Normal Normal Normal Normal  L. Pronator teres Normal None None None _______ Normal Normal Normal Normal  L. Biceps brachii Normal None None None _______ Normal Normal Normal Normal  L. Deltoid Normal None None None _______ Normal Normal Normal Normal  L. Triceps brachii Normal None None None _______ Normal Normal Normal  Normal  L. Cervical paraspinals Normal None None None _______ Normal Normal Normal Normal  L. Tibialis anterior Normal None None None _______ Normal Normal Normal Normal  L. Tibialis posterior Normal None None None _______ Normal Normal Normal Normal  L. Peroneus longus Normal None None None _______ Normal Normal Normal Normal  L. Gastrocnemius (Medial head) Normal None None None _______ Normal Normal Normal Normal  L. Vastus lateralis Normal None None None _______ Normal Normal Normal Normal

## 2020-02-19 DIAGNOSIS — R42 Dizziness and giddiness: Secondary | ICD-10-CM | POA: Diagnosis not present

## 2020-02-19 DIAGNOSIS — R251 Tremor, unspecified: Secondary | ICD-10-CM | POA: Diagnosis not present

## 2020-02-19 DIAGNOSIS — R7303 Prediabetes: Secondary | ICD-10-CM | POA: Diagnosis not present

## 2020-02-19 DIAGNOSIS — K920 Hematemesis: Secondary | ICD-10-CM | POA: Diagnosis not present

## 2020-02-19 DIAGNOSIS — R110 Nausea: Secondary | ICD-10-CM | POA: Diagnosis not present

## 2020-02-19 DIAGNOSIS — H6091 Unspecified otitis externa, right ear: Secondary | ICD-10-CM | POA: Diagnosis not present

## 2020-02-26 DIAGNOSIS — H8110 Benign paroxysmal vertigo, unspecified ear: Secondary | ICD-10-CM | POA: Diagnosis not present

## 2020-02-26 DIAGNOSIS — K920 Hematemesis: Secondary | ICD-10-CM | POA: Diagnosis not present

## 2020-02-26 DIAGNOSIS — R42 Dizziness and giddiness: Secondary | ICD-10-CM | POA: Diagnosis not present

## 2020-02-26 DIAGNOSIS — R110 Nausea: Secondary | ICD-10-CM | POA: Diagnosis not present

## 2020-02-28 ENCOUNTER — Encounter: Payer: Self-pay | Admitting: Nurse Practitioner

## 2020-03-20 DIAGNOSIS — K920 Hematemesis: Secondary | ICD-10-CM | POA: Diagnosis not present

## 2020-03-20 DIAGNOSIS — R110 Nausea: Secondary | ICD-10-CM | POA: Diagnosis not present

## 2020-03-20 DIAGNOSIS — R42 Dizziness and giddiness: Secondary | ICD-10-CM | POA: Diagnosis not present

## 2020-03-20 DIAGNOSIS — H8110 Benign paroxysmal vertigo, unspecified ear: Secondary | ICD-10-CM | POA: Diagnosis not present

## 2020-03-25 DIAGNOSIS — M25512 Pain in left shoulder: Secondary | ICD-10-CM | POA: Diagnosis not present

## 2020-03-28 DIAGNOSIS — M25512 Pain in left shoulder: Secondary | ICD-10-CM | POA: Diagnosis not present

## 2020-03-31 NOTE — Progress Notes (Signed)
03/31/2020 Joshua Brennan [id number] [birth year]   CHIEF COMPLAINT: Hematemesis   HISTORY OF PRESENT ILLNESS:  Joshua Brennan is a 49 year old male with a past medical history of anxiety, vertigo, left hand tremors secondary to left ulnar neuropathy. Right knee arthroscopy age 62. Left inguinal hernia surgery age 24.  He was referred to our office by Dr.John Nevada Crane for further evaluation regarding hematemesis. He developed stomach discomfort with nausea on 02/19/2020 while working the night shift. He ate Jell-O with fruit earlier in his shift. He developed stomach pain, worsening nausea and vomited up the Jell-O and fruit he ate earlier. He vomited a second time which consisted of a moderate amount of dark red blood. He vomited a third time which consisted of a small amount of dark red blood. He described feeling as if his stomach was in overdrive. Since that time, no further vomiting but his nausea continues. He reports having nausea and dizziness which started April or May 2021. He was seen by neurology and a brain MRI 12/2019 was reported as normal. He was assessed to most likely have vertigo and he was referred to ENT Dr. Benjamine Mola, his consult is scheduled on 04/17/2020. He is taking Meclizine with symptom relief. He reports being diagnosed with acid reflux more than 5 years ago. He thinks he may have had an EGD 5 years ago but he is not sure, he does not remember. He took AcipHex for a brief period of time but it caused nausea so he stopped taking it. He has intermittent heartburn, sometimes for a week then gone for several weeks. No recent dysphagia. He takes baking soda infrequently for stomach upset. He takes Aleve 2 tabs once daily as needed for generalized aches and pains, approximately 15 days monthly for the past year. He tarted taking Celebrex 200 mg 1 p.o. twice daily approximately 1 week ago due to having a torn left rotator cuff. No other NSAIDs. He denies any history of heavy alcohol use. He  stopped drinking all alcohol 09/2019. He smokes cigarettes less than 1 pack on and off since he was 49 years old. He has lost 14 pounds over the past 3 to 4 months. He stated his usual weight was 180 pounds. His weight in our office today is 166 pounds. No fever, sweats or chills. His appetite has decreased. He has intermittent upper abdominal discomfort and bloat when eating. No specific food triggers. He previously passed a normal formed brown bowel movement once every day or every other day. However, for the past 6 weeks he has been constipated. He is passing a small or brown stool every 3 to 4 days. He saw a small amount of red blood on the toilet tissue and on the stool on 1 occasion several weeks ago. No melena. No rectal pain. No lower abdominal pain. No known family history of esophageal, gastric or colon cancer. He has not received the Covid vaccination.  Labs done 02/19/2020 showed WBC 10.4. Hemoglobin 15.9. Hematocrit 48.2. Platelet 260. Calcium 10.3. Alk phos 125. AST 14. ALT 12. Total bili 2.0. BUN 19. Creatinine 1.19.   CBC Latest Ref Rng & Units 12/06/2019  WBC 3.4 - 10.8 x10E3/uL 9.3  Hemoglobin 13.0 - 17.7 g/dL 15.3  Hematocrit 37.5 - 51.0 % 45.6    CMP Latest Ref Rng & Units 12/06/2019  Glucose 65 - 99 mg/dL 92  BUN 6 - 24 mg/dL 12  Creatinine 0.76 - 1.27 mg/dL 1.09  Sodium 134 -  144 mmol/L 142  Potassium 3.5 - 5.2 mmol/L 4.1  Chloride 96 - 106 mmol/L 106  CO2 20 - 29 mmol/L 24  Calcium 8.7 - 10.2 mg/dL 9.3  Total Protein 6.0 - 8.5 g/dL 6.9  Total Bilirubin 0.0 - 1.2 mg/dL 0.2  Alkaline Phos 39 - 117 IU/L 121(H)  AST 0 - 40 IU/L 13  ALT 0 - 44 IU/L 10    Past Medical History:  Diagnosis Date  . Anxiety   . Dizziness   . Tremor     Social History:  [employer], off work since 02/19/2020 due to vertigo. He smokes cigarettes almost 1 ppd since age 36. No alcohol since 09/15/2019. Past marijuana use, last used 4 weeks ago.   Family History:  Mother died age 2 from  diabetes complications. Father deceased, history unknown. Sister age 31.  Maternal grandmother with diabetes.   No Known Allergies    Outpatient Encounter Medications as of 04/02/2020  Medication Sig  . ibuprofen (ADVIL) 600 MG tablet Take 600 mg by mouth 4 (four) times daily as needed.  . meclizine (ANTIVERT) 25 MG tablet Take 25 mg by mouth 3 (three) times daily as needed for dizziness.  . sildenafil (VIAGRA) 50 MG tablet Take 50 mg by mouth daily as needed for erectile dysfunction.   No facility-administered encounter medications on file as of 04/02/2020.     REVIEW OF SYSTEMS:   Gen: 14 pound weight loss. No fever, sweats or chills. CV: Denies chest pain, palpitations or edema. Resp: Denies cough, shortness of breath of hemoptysis.  GI: See HPI. GU : Weak urinary stream, forces urination. No hematuria. MS: Denies joint pain, muscles aches or weakness. Derm: Denies rash, itchiness, skin lesions or unhealing ulcers. Psych: + anxiety, dizziness. Depression or suicidal ideation. Heme: Denies bruising, bleeding. Neuro:  Dizziness.  Endo:  Denies any problems with DM, thyroid or adrenal function.    PHYSICAL EXAM: BP 120/80 (BP Location: Left Arm, Patient Position: Sitting, Cuff Size: Normal)   Pulse (!) 108   Ht _0  (1.803 m) Comment: height measured without shoes  Wt 166 lb 4 oz (75.4 kg)   BMI 23.19 kg/m   General: Anxious appearing 49 year old male in no acute distress. Head: Normocephalic and atraumatic. Eyes:  Sclerae non-icteric, conjunctive pink. Ears: Normal auditory acuity. Mouth: Dentition intact. Several gold fillings. No ulcers or lesions.  Neck: Supple, no lymphadenopathy or thyromegaly.  Lungs: Clear bilaterally to auscultation without wheezes, crackles or rhonchi. Heart: Tachycardic. No murmur, rub or gallop appreciated.  Abdomen: Soft, nontender, non distended. No masses. No hepatosplenomegaly. Normoactive bowel sounds x 4 quadrants.  Rectal:  Deferred. Musculoskeletal: Symmetrical with no gross deformities. Skin: Warm and dry. No rash or lesions on visible extremities. Extremities: No edema. Neurological: Alert oriented x 4, no focal deficits.  Psychological:  Alert and cooperative. Normal mood and affect.  ASSESSMENT AND PLAN:  13. 49 year old male with a history of GERD with hematemesis x 2 episodes on 02/19/2020 without recurrence. -EGD to rule out GERD/PUD/upper GI malignancy. EGD benefits and risks discussed including risk with sedation, risk of bleeding, perforation and infection  -Famotidine 20 mg 1 p.o. daily for now (PPI deferred for now as patient did not previously tolerate Aciphex), will most likely require PPI -Decrease Celebrex to once daily, avoid all other NSAIDs -Further recommendations to be determined after EGD completed  2. Rectal bleeding x 1 episode. Never had a screening colonoscopy.  -Colonoscopy benefits and risks discussed including risk with sedation,  risk of bleeding, perforation and infection    3. Mildly elevated alk phos level with normal AST/ALT levels. Calcium level  mildly elevated.  -Hepatic panel, GGT -See plan in #4  4. Weight loss -I would recommend a chest/abdominal/pelvic CT with contrast. He is anxious at this time, he will proceed with the EGD and colonoscopy next week. I discussed scheduling a chest/abdominal/pelvic CT after he completes his EGD and colonoscopy.  5. Constipation -MiraLAX nightly as needed as tolerated  6. Decreased urinary flow -Follow up with Dr. Nevada Crane for urology consult  7. Vertigo. Negative brain MRI. ENT consult scheduled 04/17/2020.     CC:  Celene Squibb, MD

## 2020-04-01 DIAGNOSIS — M25512 Pain in left shoulder: Secondary | ICD-10-CM | POA: Diagnosis not present

## 2020-04-02 ENCOUNTER — Other Ambulatory Visit (INDEPENDENT_AMBULATORY_CARE_PROVIDER_SITE_OTHER): Payer: BC Managed Care – PPO

## 2020-04-02 ENCOUNTER — Encounter: Payer: Self-pay | Admitting: Nurse Practitioner

## 2020-04-02 ENCOUNTER — Ambulatory Visit: Payer: BC Managed Care – PPO | Admitting: Nurse Practitioner

## 2020-04-02 VITALS — BP 120/80 | HR 108 | Ht 71.0 in | Wt 166.2 lb

## 2020-04-02 DIAGNOSIS — K219 Gastro-esophageal reflux disease without esophagitis: Secondary | ICD-10-CM | POA: Diagnosis not present

## 2020-04-02 DIAGNOSIS — R634 Abnormal weight loss: Secondary | ICD-10-CM | POA: Insufficient documentation

## 2020-04-02 DIAGNOSIS — K920 Hematemesis: Secondary | ICD-10-CM | POA: Diagnosis not present

## 2020-04-02 DIAGNOSIS — K5900 Constipation, unspecified: Secondary | ICD-10-CM

## 2020-04-02 DIAGNOSIS — K625 Hemorrhage of anus and rectum: Secondary | ICD-10-CM

## 2020-04-02 DIAGNOSIS — Z01818 Encounter for other preprocedural examination: Secondary | ICD-10-CM

## 2020-04-02 LAB — BASIC METABOLIC PANEL
BUN: 18 mg/dL (ref 6–23)
CO2: 25 mEq/L (ref 19–32)
Calcium: 9.8 mg/dL (ref 8.4–10.5)
Chloride: 110 mEq/L (ref 96–112)
Creatinine, Ser: 1.1 mg/dL (ref 0.40–1.50)
GFR: 85.21 mL/min (ref >60.00)
Glucose, Bld: 107 mg/dL — ABNORMAL HIGH (ref 70–99)
Potassium: 4.1 mEq/L (ref 3.5–5.1)
Sodium: 143 mEq/L (ref 135–145)

## 2020-04-02 LAB — HEPATIC FUNCTION PANEL
ALT: 10 U/L (ref 0–53)
AST: 11 U/L (ref 0–37)
Albumin: 4.6 g/dL (ref 3.5–5.2)
Alkaline Phosphatase: 86 U/L (ref 39–117)
Bilirubin, Direct: 0 mg/dL (ref 0.0–0.3)
Total Bilirubin: 0.3 mg/dL (ref 0.2–1.2)
Total Protein: 7.8 g/dL (ref 6.0–8.3)

## 2020-04-02 LAB — CBC WITH DIFFERENTIAL/PLATELET
Basophils Absolute: 0 10*3/uL (ref 0.0–0.1)
Basophils Relative: 0.3 % (ref 0.0–3.0)
Eosinophils Absolute: 0.2 10*3/uL (ref 0.0–0.7)
Eosinophils Relative: 2.2 % (ref 0.0–5.0)
HCT: 46.4 % (ref 39.0–52.0)
Hemoglobin: 15.5 g/dL (ref 13.0–17.0)
Lymphocytes Relative: 29.1 % (ref 12.0–46.0)
Lymphs Abs: 2.5 10*3/uL (ref 0.7–4.0)
MCHC: 33.5 g/dL (ref 30.0–36.0)
MCV: 82.9 fl (ref 78.0–100.0)
Monocytes Absolute: 0.9 10*3/uL (ref 0.1–1.0)
Monocytes Relative: 9.8 % (ref 3.0–12.0)
Neutro Abs: 5.1 10*3/uL (ref 1.4–7.7)
Neutrophils Relative %: 58.6 % (ref 43.0–77.0)
Platelets: 235 10*3/uL (ref 150.0–400.0)
RBC: 5.6 Mil/uL (ref 4.22–5.81)
RDW: 13.3 % (ref 11.5–15.5)
WBC: 8.7 10*3/uL (ref 4.0–10.5)

## 2020-04-02 LAB — GAMMA GT: GGT: 24 U/L (ref 7–51)

## 2020-04-02 MED ORDER — POLYETHYLENE GLYCOL 3350 17 G PO PACK: 17.0000 g | PACK | Freq: Every evening | ORAL | 0 refills | Status: DC | PRN

## 2020-04-02 MED ORDER — FAMOTIDINE 20 MG PO TABS: 20.0000 mg | ORAL_TABLET | Freq: Every day | ORAL | 1 refills | Status: DC

## 2020-04-02 MED ORDER — SUPREP BOWEL PREP KIT 17.5-3.13-1.6 GM/177ML PO SOLN: ORAL | 0 refills | Status: DC

## 2020-04-02 NOTE — Patient Instructions (Addendum)
If you are age 51 or older, your body mass index should be between 23-30. Your Body mass index is 23.19 kg/m. If this is out of the aforementioned range listed, please consider follow up with your Primary Care Provider.  If you are age 32 or younger, your body mass index should be between 19-25. Your Body mass index is 23.19 kg/m. If this is out of the aformentioned range listed, please consider follow up with your Primary Care Provider.   You have been scheduled for an endoscopy and colonoscopy. Please follow the written instructions given to you at your visit today. Please pick up your prep supplies at the pharmacy within the next 1-3 days. If you use inhalers (even only as needed), please bring them with you on the day of your procedure.  Please go to the lab in the basement of our building to have lab work done as you leave today. Hit "B" for basement when you get on the elevator.  When the doors open the lab is on your left.  We will call you with the results. Thank you.  Due to recent changes in healthcare laws, you may see the results of your imaging and laboratory studies on MyChart before your provider has had a chance to review them.  We understand that in some cases there may be results that are confusing or concerning to you. Not all laboratory results come back in the same time frame and the provider may be waiting for multiple results in order to interpret others.  Please give [place] 48 hours in order for your provider to thoroughly review all the results before contacting the office for clarification of your results.   We have sent the following medications to your pharmacy for you to pick up at your convenience: Pepcid 20 mg: Take once daily  Please purchase the following medications over the counter and take as directed: Miralax: Take daily at bedtime for constipation  Decrease Celebrex to once daily for now.  Call the office if your symptoms worsen.  Thank you for entrusting me  with your care and for choosing [facility]

## 2020-04-02 NOTE — Progress Notes (Signed)
I agree with the above note, plan 

## 2020-04-05 LAB — ALKALINE PHOSPHATASE, ISOENZYMES
Alkaline Phosphatase: 106 IU/L (ref 48–121)
BONE FRACTION: 26 % (ref 12–68)
INTESTINAL FRAC.: 0 % (ref 0–18)
LIVER FRACTION: 74 % (ref 13–88)

## 2020-04-08 ENCOUNTER — Encounter: Payer: BC Managed Care – PPO | Admitting: Gastroenterology

## 2020-04-16 ENCOUNTER — Encounter (HOSPITAL_COMMUNITY): Payer: Self-pay | Admitting: Physical Therapy

## 2020-04-16 ENCOUNTER — Ambulatory Visit (HOSPITAL_COMMUNITY): Payer: BC Managed Care – PPO | Attending: Internal Medicine | Admitting: Physical Therapy

## 2020-04-16 ENCOUNTER — Other Ambulatory Visit: Payer: Self-pay

## 2020-04-16 DIAGNOSIS — R42 Dizziness and giddiness: Secondary | ICD-10-CM | POA: Diagnosis not present

## 2020-04-16 NOTE — Therapy (Signed)
[facility] [address] [place], [place], [address] Phone: [phone]   Fax:  [phone]  Physical Therapy Evaluation  Patient Details  Name: Joshua Brennan MRN: [id number] Date of Birth: [birth year] Referring Provider (PT): [name] MD    Encounter Date: 04/16/2020   PT End of Session - 04/16/20 1213    Visit Number 1    Number of Visits 4    Date for PT Re-Evaluation 05/16/20    Authorization Type BCBS COMM PPO    PT Start Time 1120    PT Stop Time 1205    PT Time Calculation (min) 45 min    Activity Tolerance Patient tolerated treatment well    Behavior During Therapy [facility] for tasks assessed/performed           Past Medical History:  Diagnosis Date  . Anxiety   . Dizziness   . ED (erectile dysfunction)   . Tremor   . Vertigo     Past Surgical History:  Procedure Laterality Date  . INGUINAL HERNIA REPAIR  1990  . KNEE ARTHROSCOPY Right 1987    There were no vitals filed for this visit.    Subjective Assessment - 04/16/20 1129    Subjective Patient presents to physical therapy with complaint of dizziness and balance difficulty. Patient says this began sometime in July. Says he gets dizzy when he stands up or gets out of bed. Had recent MRI which was unremarkable. Says he gets dizzy when getting up and makes him feel that he is stumbling around when he walks. Says duration of symptoms depends, sometimes a few minutes sometimes all day, depends on what he is doing. Patient says he was recently prescribing Meclizine which helps a little.    Limitations Standing;Walking;House hold activities    Diagnostic tests MRI    Patient Stated Goals get rid of dizziness    Currently in Pain? No/denies              [facility] PT Assessment - 04/16/20 0001      Assessment   Medical Diagnosis Vertigo     Referring Provider (PT) [name] MD     Onset Date/Surgical Date --   July 2021   Prior Therapy No       Precautions    Precautions None      Restrictions   Weight Bearing Restrictions No      Balance Screen   Has the patient fallen in the past 6 months Yes    How many times? 1    Has the patient had a decrease in activity level because of a fear of falling?  Yes    Is the patient reluctant to leave their home because of a fear of falling?  Yes      Home Environment   Living Environment Private residence    Living Arrangements Non-relatives/Friends      Prior Function   Level of Independence Independent    Vocation Full time employment;Other (comment)   FMLA Transport planner at [employer]   Overall Cognitive Status Within Functional Limits for tasks assessed      Observation/Other Assessments   Focus on Therapeutic Outcomes (FOTO)  46% limited                   Vestibular Assessment - 04/16/20 0001      Symptom Behavior   Type of Dizziness  Spinning  Frequency of Dizziness at least 3-4 x daily     Duration of Dizziness varies     Symptom Nature Motion provoked;Positional    Aggravating Factors Sit to stand;Supine to sit;Rolling to right;Rolling to left;Mornings;Turning head quickly;Turning body quickly    Relieving Factors Head stationary    Progression of Symptoms No change since onset      Oculomotor Exam   Ocular ROM WNL    Smooth Pursuits Intact    Saccades Intact   slight dizziness     Vestibulo-Ocular Reflex   VOR 1 Head Only (x 1 viewing) Overshoot toward RT x 1, otherwise WNL       Positional Testing   Dix-Hallpike Dix-Hallpike Right;Dix-Hallpike Left      Dix-Hallpike Right   Dix-Hallpike Right Symptoms No nystagmus   dizziness lasting 2-3 seconds with rolling LT      Dix-Hallpike Left   Dix-Hallpike Left Symptoms --   Negative     Positional Sensitivities   Sit to Supine Mild dizziness              Objective measurements completed on examination: See above findings.               PT Education - 04/16/20 1129    Education  Details on evaluaiton findings, POC and HEP    Person(s) Educated Patient    Methods Explanation    Comprehension Verbalized understanding            PT Short Term Goals - 04/16/20 1216      PT SHORT TERM GOAL #1   Title Patient will be independent with initial HEP and self-management strategies to improve functional outcomes    Time 2    Period Weeks    Status New    Target Date 05/02/20             PT Long Term Goals - 04/16/20 1217      PT LONG TERM GOAL #1   Title Patient will improve FOTO score by15% to indicate improvement in functional outcomes    Time 4    Period Weeks    Status New    Target Date 05/16/20      PT LONG TERM GOAL #2   Title Patient will report at least 50% overall improvement in subjective complaint to indicate improvement in ability to perform ADLs.    Time 4    Period Weeks    Status New    Target Date 05/16/20      PT LONG TERM GOAL #3   Title Patient will have decreased dizziness episodes/ frequency by at least 50% to improve functional outcomes and QOL.    Time 4    Period Weeks    Status New    Target Date 05/16/20                  Plan - 04/16/20 1214    Clinical Impression Statement Patient is a 49 y.o. male who presents to physical therapy with complaint of vertigo/ dizziness. Patient demonstrates decreased perceived functional ability, dynamic balance and gait abnormality and symptom exacerbation with vestibular testing which are negatively impacting patient ability to perform ADLs and functional mobility tasks. Patient will benefit from skilled physical therapy services to address these deficits to improve level of function with ADLs, functional mobility tasks, and reduce risk for falls.    Examination-Activity Limitations Stairs;Bed Mobility;Locomotion Level;Bend    Examination-Participation Restrictions Driving;Occupation;Community Activity;Cleaning;Yard Work    Conservation officer, historic buildings  Stable/Uncomplicated     Clinical Decision Making Low    Rehab Potential Fair    PT Frequency 1x / week    PT Duration 3 weeks    PT Treatment/Interventions ADLs/Self Care Home Management;Manual techniques;Patient/family education;Therapeutic activities;Functional mobility training;Stair training;Gait training;DME Instruction;Therapeutic exercise;Balance training;Vestibular;Visual/perceptual remediation/compensation;Passive range of motion;Neuromuscular re-education;Canalith Repostioning;Biofeedback    PT Next Visit Plan Review goals and HEP. Assess static balance and orthostatic response with activity next visit. Progress activity accordingly    PT Home Exercise Plan 04/16/20: VOR x 1, saccades    Consulted and Agree with Plan of Care Patient           Patient will benefit from skilled therapeutic intervention in order to improve the following deficits and impairments:  Dizziness, Decreased balance, Decreased activity tolerance, Impaired perceived functional ability  Visit Diagnosis: Dizziness and giddiness     Problem List Patient Active Problem List   Diagnosis Date Noted  . Hematemesis 04/02/2020  . Loss of weight 04/02/2020  . Neuropathy of left ulnar nerve at wrist 01/02/2020  . Depression 01/02/2020  . Autonomic disorder 12/06/2019    12:22 PM, 04/16/20 Georges Lynch PT DPT  Physical Therapist with [facility]  [facility]  [phone]   [facility] [address] [place], [place], [address] Phone: [phone]   Fax:  [phone]  Name: CAYLAN SCHIFANO MRN: [id number] Date of Birth: 1971-01-08

## 2020-04-17 DIAGNOSIS — R42 Dizziness and giddiness: Secondary | ICD-10-CM | POA: Diagnosis not present

## 2020-04-17 DIAGNOSIS — H903 Sensorineural hearing loss, bilateral: Secondary | ICD-10-CM | POA: Diagnosis not present

## 2020-04-22 DIAGNOSIS — R7303 Prediabetes: Secondary | ICD-10-CM | POA: Diagnosis not present

## 2020-04-22 DIAGNOSIS — H6091 Unspecified otitis externa, right ear: Secondary | ICD-10-CM | POA: Diagnosis not present

## 2020-04-22 DIAGNOSIS — R42 Dizziness and giddiness: Secondary | ICD-10-CM | POA: Diagnosis not present

## 2020-04-22 DIAGNOSIS — R251 Tremor, unspecified: Secondary | ICD-10-CM | POA: Diagnosis not present

## 2020-04-28 DIAGNOSIS — R7301 Impaired fasting glucose: Secondary | ICD-10-CM | POA: Diagnosis not present

## 2020-04-28 DIAGNOSIS — R42 Dizziness and giddiness: Secondary | ICD-10-CM | POA: Diagnosis not present

## 2020-04-28 DIAGNOSIS — E559 Vitamin D deficiency, unspecified: Secondary | ICD-10-CM | POA: Diagnosis not present

## 2020-04-28 DIAGNOSIS — R251 Tremor, unspecified: Secondary | ICD-10-CM | POA: Diagnosis not present

## 2020-04-28 DIAGNOSIS — R7303 Prediabetes: Secondary | ICD-10-CM | POA: Diagnosis not present

## 2020-04-28 DIAGNOSIS — H6091 Unspecified otitis externa, right ear: Secondary | ICD-10-CM | POA: Diagnosis not present

## 2020-04-30 DIAGNOSIS — R42 Dizziness and giddiness: Secondary | ICD-10-CM | POA: Diagnosis not present

## 2020-05-01 ENCOUNTER — Other Ambulatory Visit: Payer: Self-pay

## 2020-05-01 ENCOUNTER — Encounter (HOSPITAL_BASED_OUTPATIENT_CLINIC_OR_DEPARTMENT_OTHER): Payer: Self-pay | Admitting: Orthopaedic Surgery

## 2020-05-01 NOTE — Progress Notes (Addendum)

## 2020-05-03 ENCOUNTER — Other Ambulatory Visit (HOSPITAL_COMMUNITY)
Admission: RE | Admit: 2020-05-03 | Discharge: 2020-05-03 | Disposition: A | Discharge status: Home / Self Care | Payer: BC Managed Care – PPO | Source: Ambulatory Visit | Attending: Orthopaedic Surgery | Admitting: Orthopaedic Surgery

## 2020-05-03 DIAGNOSIS — Z20822 Contact with and (suspected) exposure to covid-19: Secondary | ICD-10-CM | POA: Diagnosis not present

## 2020-05-03 DIAGNOSIS — Z01812 Encounter for preprocedural laboratory examination: Secondary | ICD-10-CM | POA: Diagnosis not present

## 2020-05-03 LAB — SARS CORONAVIRUS 2 (TAT 6-24 HRS): SARS Coronavirus 2: NEGATIVE

## 2020-05-06 DIAGNOSIS — H903 Sensorineural hearing loss, bilateral: Secondary | ICD-10-CM | POA: Diagnosis not present

## 2020-05-06 DIAGNOSIS — H832X1 Labyrinthine dysfunction, right ear: Secondary | ICD-10-CM | POA: Diagnosis not present

## 2020-05-06 DIAGNOSIS — R42 Dizziness and giddiness: Secondary | ICD-10-CM | POA: Diagnosis not present

## 2020-05-06 NOTE — H&P (Signed)
PREOPERATIVE H&P  Chief Complaint: LEFT SHOULDER IMPINGEMENT SYNDROM STRAIN OF MUSCLES AND TENDONS ROTATOR CUFF, BICIPITAL  HPI: Joshua Brennan is a 49 y.o. male who presents for preoperative history and physical prior to scheduled surgery, Procedure(s): LEFT SHOULDER ARTHROSCOPY  DEBRIDEMENT WITH ROTATOR CUFF REPAIR AND SUBACROMIAL DECOMPRESSION PARTIAL ACROMIOPLASTY BICEPS TENODESIS ULNAR NERVE AT ELBOW NEUROPLASTY AND TRANSPOSITION.   Patient has a past medical history significant for vertigo and tremor.   The patient is a 49 year old who works at [employer] and presents with left shoulder pain.  He notes that his left shoulder has bothered him off and on for years.  He believes he injured it at work about 13 years ago, but the pain would come and go.  Recently in the past 4-6 weeks the pain has gotten more constant.  He is unable to sleep at night due to the pain.  It is sharp and severe with certain motions.  He feels like his range of motion is very limited and he feels weak on the left side.  He has previously been seen by his primary care physician, as well as neurologist for worsening of his left hand tremor as well as numbness and tingling in his 4th and 5th fingers on the left side.  He saw Dr. Krista Blue  in May 2021 for EMG, which showed mild ulnar neuropathy across the left elbow.  He notes the numbness in his 4th and 5th fingers is worse at night when he is lying in bed and has his hand resting on his chest.  His symptoms are rated as moderate to severe, and have been worsening.  This is significantly impairing activities of daily living.    Please see clinic note for further details on this patient's care.    He has elected for surgical management.   Past Medical History:  Diagnosis Date  . Anxiety   . Dizziness   . ED (erectile dysfunction)   . Tremor   . Vertigo    Past Surgical History:  Procedure Laterality Date  . INGUINAL HERNIA REPAIR  1990  . KNEE  ARTHROSCOPY Right 1987   Social History   Socioeconomic History  . Marital status: Divorced    Spouse name: Not on file  . Number of children: 2  . Years of education: some college  . Highest education level: Not on file  Occupational History  . Occupation: Designer, fashion/clothing  Tobacco Use  . Smoking status: Current Every Day Smoker    Packs/day: 1.00    Types: Cigarettes  . Smokeless tobacco: Never Used  Vaping Use  . Vaping Use: Never used  Substance and Sexual Activity  . Alcohol use: Not Currently  . Drug use: Yes    Types: Marijuana    Comment: sometimes  . Sexual activity: Not on file  Other Topics Concern  . Not on file  Social History Narrative   Left handed.   Lives at home with significant other.   He drinks 8-10 cans of double espresso cold coffee each week.   Social Determinants of Health   Financial Resource Strain:   . Difficulty of Paying Living Expenses: Not on file  Food Insecurity:   . Worried About Charity fundraiser in the Last Year: Not on file  . Ran Out of Food in the Last Year: Not on file  Transportation Needs:   . Lack of Transportation (Medical): Not on file  . Lack of Transportation (Non-Medical): Not on  file  Physical Activity:   . Days of Exercise per Week: Not on file  . Minutes of Exercise per Session: Not on file  Stress:   . Feeling of Stress : Not on file  Social Connections:   . Frequency of Communication with Friends and Family: Not on file  . Frequency of Social Gatherings with Friends and Family: Not on file  . Attends Religious Services: Not on file  . Active Member of Clubs or Organizations: Not on file  . Attends Archivist Meetings: Not on file  . Marital Status: Not on file   Family History  Problem Relation Age of Onset  . Diabetes Mother   . Pneumonia Father   . Diabetes Maternal Grandmother   . Heart attack Maternal Uncle 40   No Known Allergies Prior to Admission medications   Medication Sig Start Date  End Date Taking? Authorizing Provider  celecoxib (CELEBREX) 100 MG capsule Take 100 mg by mouth 2 (two) times daily. 03/25/20  Yes [provider]  meclizine (ANTIVERT) 25 MG tablet Take 25 mg by mouth 3 (three) times daily as needed for dizziness.   Yes [provider]  polyethylene glycol (MIRALAX) 17 g packet Take 17 g by mouth at bedtime as needed. 04/02/20   Noralyn Pick, NP  sildenafil (VIAGRA) 50 MG tablet Take 50 mg by mouth daily as needed for erectile dysfunction.    [provider]  SUPREP BOWEL PREP KIT 17.5-3.13-1.6 GM/177ML SOLN Suprep-Use as directed 04/02/20   Noralyn Pick, NP    ROS: All other systems have been reviewed and were otherwise negative with the exception of those mentioned in the HPI and as above.  Physical Exam: General: Alert, no acute distress Cardiovascular: No pedal edema Respiratory: No cyanosis, no use of accessory musculature GI: No organomegaly, abdomen is soft and non-tender Skin: No lesions in the area of chief complaint Neurologic: Sensation intact distally Psychiatric: Patient is competent for consent with normal mood and affect Lymphatic: No axillary or cervical lymphadenopathy  MUSCULOSKELETAL:  Left shoulder demonstrates active forward elevation to 170 with pain with circumduction during that range of motion.  Negative AC tenderness to palpation.  Positive impingement.  Positive O'Brien's.  Positive Tinel's at the cubital tunnel.  4/5 supraspinatus strength.    Imaging: MRI demonstrates a near full thickness tear of the chondroid portion of the supraspinatus and infraspinatus.  Type II acromion.  Bicipital fluid noted.  Assessment: LEFT ROTATOR CUFF TEAR CUBITAL TUNNEL SYNDROME LEFT SIDE  Plan: Plan for Procedure(s): LEFT SHOULDER ARTHROSCOPY  DEBRIDEMENT WITH ROTATOR CUFF REPAIR AND SUBACROMIAL DECOMPRESSION PARTIAL ACROMIOPLASTY BICEPS TENODESIS ULNAR NERVE AT ELBOW NEUROPLASTY AND  TRANSPOSITION  Plan: combination of ulnar nerve decompression and transposition along with arthroscopic rotator cuff repair, biceps tenodesis, subacromial decompression and debridement would be reasonable.  This will be slightly longer case than normal.  We would likely plan to do his whole case on Allen table with an ironing board style hand table and then sit him up after his elbow procedure is done and do his shoulder.    The risks benefits and alternatives were discussed with the patient including but not limited to the risks of nonoperative treatment, versus surgical intervention including infection, bleeding, nerve injury,  blood clots, cardiopulmonary complications, morbidity, mortality, among others, and they were willing to proceed.   The patient acknowledged the explanation, agreed to proceed with the plan and consent was signed.   Operative Plan: combination of ulnar nerve  decompression and transposition along with arthroscopic rotator cuff repair, biceps tenodesis, subacromial decompression and debridement  Discharge Medications: Standard DVT Prophylaxis: None Physical Therapy: Outpatient PT Special Discharge needs: [place], PA-C  05/06/2020 4:14 PM

## 2020-05-07 ENCOUNTER — Ambulatory Visit (HOSPITAL_BASED_OUTPATIENT_CLINIC_OR_DEPARTMENT_OTHER): Payer: BC Managed Care – PPO | Admitting: Anesthesiology

## 2020-05-07 ENCOUNTER — Ambulatory Visit (HOSPITAL_BASED_OUTPATIENT_CLINIC_OR_DEPARTMENT_OTHER)
Admission: RE | Admit: 2020-05-07 | Discharge: 2020-05-07 | Disposition: A | Discharge status: Home / Self Care | Payer: BC Managed Care – PPO | Attending: Orthopaedic Surgery | Admitting: Orthopaedic Surgery

## 2020-05-07 ENCOUNTER — Encounter (HOSPITAL_BASED_OUTPATIENT_CLINIC_OR_DEPARTMENT_OTHER)
Admission: RE | Disposition: A | Discharge status: Home / Self Care | Payer: Self-pay | Source: Home / Self Care | Attending: Orthopaedic Surgery

## 2020-05-07 ENCOUNTER — Other Ambulatory Visit: Payer: Self-pay

## 2020-05-07 ENCOUNTER — Encounter (HOSPITAL_BASED_OUTPATIENT_CLINIC_OR_DEPARTMENT_OTHER): Payer: Self-pay | Admitting: Orthopaedic Surgery

## 2020-05-07 DIAGNOSIS — Z79899 Other long term (current) drug therapy: Secondary | ICD-10-CM | POA: Diagnosis not present

## 2020-05-07 DIAGNOSIS — M7542 Impingement syndrome of left shoulder: Secondary | ICD-10-CM | POA: Insufficient documentation

## 2020-05-07 DIAGNOSIS — F419 Anxiety disorder, unspecified: Secondary | ICD-10-CM | POA: Insufficient documentation

## 2020-05-07 DIAGNOSIS — R251 Tremor, unspecified: Secondary | ICD-10-CM | POA: Insufficient documentation

## 2020-05-07 DIAGNOSIS — Z791 Long term (current) use of non-steroidal anti-inflammatories (NSAID): Secondary | ICD-10-CM | POA: Diagnosis not present

## 2020-05-07 DIAGNOSIS — Z833 Family history of diabetes mellitus: Secondary | ICD-10-CM | POA: Insufficient documentation

## 2020-05-07 DIAGNOSIS — M7522 Bicipital tendinitis, left shoulder: Secondary | ICD-10-CM | POA: Diagnosis not present

## 2020-05-07 DIAGNOSIS — M7592 Shoulder lesion, unspecified, left shoulder: Secondary | ICD-10-CM | POA: Diagnosis not present

## 2020-05-07 DIAGNOSIS — Z8249 Family history of ischemic heart disease and other diseases of the circulatory system: Secondary | ICD-10-CM | POA: Diagnosis not present

## 2020-05-07 DIAGNOSIS — M75102 Unspecified rotator cuff tear or rupture of left shoulder, not specified as traumatic: Secondary | ICD-10-CM | POA: Diagnosis not present

## 2020-05-07 DIAGNOSIS — M24112 Other articular cartilage disorders, left shoulder: Secondary | ICD-10-CM | POA: Diagnosis not present

## 2020-05-07 DIAGNOSIS — S46012A Strain of muscle(s) and tendon(s) of the rotator cuff of left shoulder, initial encounter: Secondary | ICD-10-CM | POA: Diagnosis not present

## 2020-05-07 DIAGNOSIS — R42 Dizziness and giddiness: Secondary | ICD-10-CM | POA: Insufficient documentation

## 2020-05-07 DIAGNOSIS — F17210 Nicotine dependence, cigarettes, uncomplicated: Secondary | ICD-10-CM | POA: Insufficient documentation

## 2020-05-07 DIAGNOSIS — Z836 Family history of other diseases of the respiratory system: Secondary | ICD-10-CM | POA: Diagnosis not present

## 2020-05-07 DIAGNOSIS — G5622 Lesion of ulnar nerve, left upper limb: Secondary | ICD-10-CM | POA: Diagnosis not present

## 2020-05-07 DIAGNOSIS — G8918 Other acute postprocedural pain: Secondary | ICD-10-CM | POA: Diagnosis not present

## 2020-05-07 DIAGNOSIS — F329 Major depressive disorder, single episode, unspecified: Secondary | ICD-10-CM | POA: Insufficient documentation

## 2020-05-07 DIAGNOSIS — M75112 Incomplete rotator cuff tear or rupture of left shoulder, not specified as traumatic: Secondary | ICD-10-CM | POA: Diagnosis not present

## 2020-05-07 HISTORY — PX: ULNAR NERVE TRANSPOSITION: SHX2595

## 2020-05-07 HISTORY — PX: BICEPT TENODESIS: SHX5116

## 2020-05-07 HISTORY — PX: SHOULDER ARTHROSCOPY WITH ROTATOR CUFF REPAIR AND SUBACROMIAL DECOMPRESSION: SHX5686

## 2020-05-07 SURGERY — SHOULDER ARTHROSCOPY WITH ROTATOR CUFF REPAIR AND SUBACROMIAL DECOMPRESSION
Anesthesia: General | Site: Shoulder | Laterality: Left

## 2020-05-07 MED ORDER — ACETAMINOPHEN 160 MG/5ML PO SOLN: 325.0000 mg | ORAL | Status: DC | PRN

## 2020-05-07 MED ORDER — DEXAMETHASONE SODIUM PHOSPHATE 10 MG/ML IJ SOLN
INTRAMUSCULAR | Status: AC
  Filled 2020-05-07: qty 1

## 2020-05-07 MED ORDER — ONDANSETRON HCL 4 MG/2ML IJ SOLN
INTRAMUSCULAR | Status: AC
  Filled 2020-05-07: qty 2

## 2020-05-07 MED ORDER — CEFAZOLIN SODIUM-DEXTROSE 2-4 GM/100ML-% IV SOLN
INTRAVENOUS | Status: AC
  Filled 2020-05-07: qty 100

## 2020-05-07 MED ORDER — PROPOFOL 10 MG/ML IV BOLUS
INTRAVENOUS | Status: AC
  Filled 2020-05-07: qty 20

## 2020-05-07 MED ORDER — ONDANSETRON HCL 4 MG PO TABS: 4.0000 mg | ORAL_TABLET | Freq: Three times a day (TID) | ORAL | 1 refills | Status: AC | PRN

## 2020-05-07 MED ORDER — CEFAZOLIN SODIUM-DEXTROSE 2-4 GM/100ML-% IV SOLN
2.0000 g | INTRAVENOUS | Status: AC
  Administered 2020-05-07: 2 g via INTRAVENOUS

## 2020-05-07 MED ORDER — VANCOMYCIN HCL POWD
Status: DC | PRN
  Administered 2020-05-07: 1000 mg

## 2020-05-07 MED ORDER — PHENYLEPHRINE 40 MCG/ML (10ML) SYRINGE FOR IV PUSH (FOR BLOOD PRESSURE SUPPORT)
PREFILLED_SYRINGE | INTRAVENOUS | Status: AC
  Filled 2020-05-07: qty 10

## 2020-05-07 MED ORDER — LIDOCAINE 2% (20 MG/ML) 5 ML SYRINGE
INTRAMUSCULAR | Status: AC
  Filled 2020-05-07: qty 5

## 2020-05-07 MED ORDER — OXYCODONE HCL 5 MG/5ML PO SOLN: 5.0000 mg | Freq: Once | ORAL | Status: DC | PRN

## 2020-05-07 MED ORDER — VANCOMYCIN HCL 1000 MG IV SOLR
INTRAVENOUS | Status: AC
  Filled 2020-05-07: qty 4000

## 2020-05-07 MED ORDER — EPHEDRINE SULFATE-NACL 50-0.9 MG/10ML-% IV SOSY
PREFILLED_SYRINGE | INTRAVENOUS | Status: DC | PRN
  Administered 2020-05-07: 20 mg via INTRAVENOUS

## 2020-05-07 MED ORDER — BUPIVACAINE-EPINEPHRINE (PF) 0.5% -1:200000 IJ SOLN
INTRAMUSCULAR | Status: DC | PRN
  Administered 2020-05-07: 15 mL

## 2020-05-07 MED ORDER — FENTANYL CITRATE (PF) 100 MCG/2ML IJ SOLN
25.0000 ug | INTRAMUSCULAR | Status: DC | PRN
  Administered 2020-05-07: 25 ug via INTRAVENOUS

## 2020-05-07 MED ORDER — ONDANSETRON HCL 4 MG/2ML IJ SOLN: 4.0000 mg | Freq: Once | INTRAMUSCULAR | Status: DC | PRN

## 2020-05-07 MED ORDER — SODIUM CHLORIDE 0.9 % IR SOLN
Status: DC | PRN
  Administered 2020-05-07: 11000 mL

## 2020-05-07 MED ORDER — PROPOFOL 10 MG/ML IV BOLUS
INTRAVENOUS | Status: DC | PRN
  Administered 2020-05-07: 200 mg via INTRAVENOUS

## 2020-05-07 MED ORDER — CELECOXIB 100 MG PO CAPS: 100.0000 mg | ORAL_CAPSULE | Freq: Two times a day (BID) | ORAL | 0 refills | Status: AC

## 2020-05-07 MED ORDER — MIDAZOLAM HCL 2 MG/2ML IJ SOLN
INTRAMUSCULAR | Status: AC
  Filled 2020-05-07: qty 2

## 2020-05-07 MED ORDER — LACTATED RINGERS IV SOLN: INTRAVENOUS | Status: DC

## 2020-05-07 MED ORDER — ACETAMINOPHEN 500 MG PO TABS: 1000.0000 mg | ORAL_TABLET | Freq: Three times a day (TID) | ORAL | 0 refills | Status: AC

## 2020-05-07 MED ORDER — PHENYLEPHRINE HCL-NACL 10-0.9 MG/250ML-% IV SOLN
INTRAVENOUS | Status: DC | PRN
  Administered 2020-05-07: 50 ug/min via INTRAVENOUS

## 2020-05-07 MED ORDER — OXYCODONE HCL 5 MG PO TABS: 5.0000 mg | ORAL_TABLET | Freq: Once | ORAL | Status: DC | PRN

## 2020-05-07 MED ORDER — ONDANSETRON HCL 4 MG/2ML IJ SOLN
INTRAMUSCULAR | Status: DC | PRN
  Administered 2020-05-07: 4 mg via INTRAVENOUS

## 2020-05-07 MED ORDER — MEPERIDINE HCL 25 MG/ML IJ SOLN: 6.2500 mg | INTRAMUSCULAR | Status: DC | PRN

## 2020-05-07 MED ORDER — BUPIVACAINE LIPOSOME 1.3 % IJ SUSP
INTRAMUSCULAR | Status: DC | PRN
  Administered 2020-05-07: 10 mL via PERINEURAL

## 2020-05-07 MED ORDER — ROCURONIUM BROMIDE 10 MG/ML (PF) SYRINGE
PREFILLED_SYRINGE | INTRAVENOUS | Status: AC
  Filled 2020-05-07: qty 10

## 2020-05-07 MED ORDER — FENTANYL CITRATE (PF) 100 MCG/2ML IJ SOLN
INTRAMUSCULAR | Status: AC
  Filled 2020-05-07: qty 2

## 2020-05-07 MED ORDER — ACETAMINOPHEN 325 MG PO TABS: 325.0000 mg | ORAL_TABLET | ORAL | Status: DC | PRN

## 2020-05-07 MED ORDER — FENTANYL CITRATE (PF) 100 MCG/2ML IJ SOLN
100.0000 ug | Freq: Once | INTRAMUSCULAR | Status: AC
  Administered 2020-05-07: 100 ug via INTRAVENOUS

## 2020-05-07 MED ORDER — FENTANYL CITRATE (PF) 100 MCG/2ML IJ SOLN
INTRAMUSCULAR | Status: AC
Start: 2020-05-07 — End: ?
  Filled 2020-05-07: qty 2

## 2020-05-07 MED ORDER — FENTANYL CITRATE (PF) 250 MCG/5ML IJ SOLN
INTRAMUSCULAR | Status: DC | PRN
Start: 2020-05-07 — End: 2020-05-07
  Administered 2020-05-07: 50 ug via INTRAVENOUS

## 2020-05-07 MED ORDER — MIDAZOLAM HCL 2 MG/2ML IJ SOLN
2.0000 mg | Freq: Once | INTRAMUSCULAR | Status: AC
  Administered 2020-05-07: 2 mg via INTRAVENOUS

## 2020-05-07 MED ORDER — EPINEPHRINE PF 1 MG/ML IJ SOLN
INTRAMUSCULAR | Status: AC
  Filled 2020-05-07: qty 4

## 2020-05-07 MED ORDER — OXYCODONE HCL 5 MG PO TABS: ORAL_TABLET | ORAL | 0 refills | Status: AC

## 2020-05-07 MED ORDER — ROCURONIUM BROMIDE 10 MG/ML (PF) SYRINGE
PREFILLED_SYRINGE | INTRAVENOUS | Status: DC | PRN
  Administered 2020-05-07: 60 mg via INTRAVENOUS

## 2020-05-07 MED ORDER — DEXAMETHASONE SODIUM PHOSPHATE 10 MG/ML IJ SOLN
INTRAMUSCULAR | Status: DC | PRN
  Administered 2020-05-07: 10 mg via INTRAVENOUS

## 2020-05-07 MED ORDER — PHENYLEPHRINE 40 MCG/ML (10ML) SYRINGE FOR IV PUSH (FOR BLOOD PRESSURE SUPPORT)
PREFILLED_SYRINGE | INTRAVENOUS | Status: DC | PRN
  Administered 2020-05-07: 160 ug via INTRAVENOUS
  Administered 2020-05-07: 120 ug via INTRAVENOUS

## 2020-05-07 MED ORDER — LIDOCAINE 2% (20 MG/ML) 5 ML SYRINGE
INTRAMUSCULAR | Status: DC | PRN
  Administered 2020-05-07 (×2): 40 mg via INTRAVENOUS

## 2020-05-07 MED FILL — Vancomycin HCl For IV Soln 1 GM (Base Equivalent): INTRAVENOUS | Qty: 1000 | Status: AC

## 2020-05-07 SURGICAL SUPPLY — 104 items
ANCH SUT SWLK 19.1X4.75 (Anchor) ×6 IMPLANT
ANCHOR SUT 1.8 FBRTK KNTLS 2SU (Anchor) ×6 IMPLANT
ANCHOR SUT BIO SW 4.75X19.1 (Anchor) ×9 IMPLANT
APL PRP STRL LF DISP 70% ISPRP (MISCELLANEOUS) ×1
BLADE EXCALIBUR 4.0X13 (MISCELLANEOUS) ×3 IMPLANT
BLADE SURG 10 STRL SS (BLADE) IMPLANT
BLADE SURG 15 STRL LF DISP TIS (BLADE) ×2 IMPLANT
BLADE SURG 15 STRL SS (BLADE) ×3
BNDG CMPR 9X4 STRL LF SNTH (GAUZE/BANDAGES/DRESSINGS) ×2
BNDG COHESIVE 4X5 TAN STRL (GAUZE/BANDAGES/DRESSINGS) IMPLANT
BNDG ELASTIC 4X5.8 VLCR STR LF (GAUZE/BANDAGES/DRESSINGS) ×3 IMPLANT
BNDG ESMARK 4X9 LF (GAUZE/BANDAGES/DRESSINGS) ×3 IMPLANT
BURR OVAL 8 FLU 4.0X13 (MISCELLANEOUS) ×3 IMPLANT
CANNULA 5.75X71 LONG (CANNULA) IMPLANT
CANNULA PASSPORT 5 (CANNULA) IMPLANT
CANNULA PASSPORT BUTTON 10-40 (CANNULA) ×3 IMPLANT
CANNULA TWIST IN 8.25X7CM (CANNULA) IMPLANT
CHLORAPREP W/TINT 26 (MISCELLANEOUS) ×3 IMPLANT
CLSR STERI-STRIP ANTIMIC 1/2X4 (GAUZE/BANDAGES/DRESSINGS) ×6 IMPLANT
COOLER ICEMAN CLASSIC (MISCELLANEOUS) ×3 IMPLANT
COVER BACK TABLE 60X90IN (DRAPES) IMPLANT
COVER MAYO STAND STRL (DRAPES) ×3 IMPLANT
COVER WAND RF STERILE (DRAPES) IMPLANT
CUFF TOURN SGL QUICK 18X4 (TOURNIQUET CUFF) ×3 IMPLANT
CUFF TOURN SGL QUICK 24 (TOURNIQUET CUFF)
CUFF TRNQT CYL 24X4X16.5-23 (TOURNIQUET CUFF) IMPLANT
DECANTER SPIKE VIAL GLASS SM (MISCELLANEOUS) IMPLANT
DRAPE EXTREMITY T 121X128X90 (DISPOSABLE) IMPLANT
DRAPE IMP U-DRAPE 54X76 (DRAPES) ×6 IMPLANT
DRAPE INCISE IOBAN 66X45 STRL (DRAPES) IMPLANT
DRAPE OEC MINIVIEW 54X84 (DRAPES) IMPLANT
DRAPE SHOULDER BEACH CHAIR (DRAPES) ×3 IMPLANT
DRAPE U-SHAPE 47X51 STRL (DRAPES) IMPLANT
DRSG AQUACEL AG ADV 3.5X 6 (GAUZE/BANDAGES/DRESSINGS) IMPLANT
DRSG PAD ABDOMINAL 8X10 ST (GAUZE/BANDAGES/DRESSINGS) ×3 IMPLANT
DW OUTFLOW CASSETTE/TUBE SET (MISCELLANEOUS) ×3 IMPLANT
ELECT REM PT RETURN 9FT ADLT (ELECTROSURGICAL) ×3
ELECTRODE REM PT RTRN 9FT ADLT (ELECTROSURGICAL) ×2 IMPLANT
EXT HOSE W/PLC CONNECTION (MISCELLANEOUS) ×3
EXTENSION HOSE W/PLC CONNECTON (MISCELLANEOUS) ×2 IMPLANT
GAUZE SPONGE 4X4 12PLY STRL (GAUZE/BANDAGES/DRESSINGS) ×6 IMPLANT
GLOVE BIO SURGEON STRL SZ 6 (GLOVE) ×3 IMPLANT
GLOVE BIO SURGEON STRL SZ 6.5 (GLOVE) ×6 IMPLANT
GLOVE BIO SURGEON STRL SZ7 (GLOVE) ×3 IMPLANT
GLOVE BIOGEL M 6.5 STRL (GLOVE) ×3 IMPLANT
GLOVE BIOGEL PI IND STRL 6.5 (GLOVE) ×4 IMPLANT
GLOVE BIOGEL PI IND STRL 7.0 (GLOVE) ×4 IMPLANT
GLOVE BIOGEL PI IND STRL 8 (GLOVE) ×2 IMPLANT
GLOVE BIOGEL PI INDICATOR 6.5 (GLOVE) ×2
GLOVE BIOGEL PI INDICATOR 7.0 (GLOVE) ×2
GLOVE BIOGEL PI INDICATOR 8 (GLOVE) ×1
GLOVE ECLIPSE 8.0 STRL XLNG CF (GLOVE) ×3 IMPLANT
GOWN STRL REUS W/ TWL LRG LVL3 (GOWN DISPOSABLE) ×8 IMPLANT
GOWN STRL REUS W/TWL LRG LVL3 (GOWN DISPOSABLE) ×12
GOWN STRL REUS W/TWL XL LVL3 (GOWN DISPOSABLE) ×3 IMPLANT
KIT STABILIZATION SHOULDER (MISCELLANEOUS) ×3 IMPLANT
KIT STR SPEAR 1.8 FBRTK DISP (KITS) ×3 IMPLANT
LASSO 90 CVE QUICKPAS (DISPOSABLE) IMPLANT
LASSO CRESCENT QUICKPASS (SUTURE) IMPLANT
LOOP VESSEL MAXI BLUE (MISCELLANEOUS) IMPLANT
MANIFOLD NEPTUNE II (INSTRUMENTS) ×3 IMPLANT
NDL SAFETY ECLIPSE 18X1.5 (NEEDLE) ×2 IMPLANT
NEEDLE HYPO 18GX1.5 SHARP (NEEDLE) ×3
NEEDLE HYPO 25X1 1.5 SAFETY (NEEDLE) IMPLANT
NEEDLE SCORPION MULTI FIRE (NEEDLE) ×3 IMPLANT
NS IRRIG 1000ML POUR BTL (IV SOLUTION) ×3 IMPLANT
PACK ARTHROSCOPY DSU (CUSTOM PROCEDURE TRAY) ×3 IMPLANT
PACK BASIN DAY SURGERY FS (CUSTOM PROCEDURE TRAY) ×3 IMPLANT
PAD CAST 4YDX4 CTTN HI CHSV (CAST SUPPLIES) ×2 IMPLANT
PAD COLD SHLDR WRAP-ON (PAD) ×3 IMPLANT
PAD ORTHO SHOULDER 7X19 LRG (SOFTGOODS) ×3 IMPLANT
PADDING CAST COTTON 4X4 STRL (CAST SUPPLIES) ×3
PENCIL SMOKE EVACUATOR (MISCELLANEOUS) ×3 IMPLANT
PORT APPOLLO RF 90DEGREE MULTI (SURGICAL WAND) ×3 IMPLANT
RESTRAINT HEAD UNIVERSAL NS (MISCELLANEOUS) ×3 IMPLANT
SHEET MEDIUM DRAPE 40X70 STRL (DRAPES) ×3 IMPLANT
SLEEVE SCD COMPRESS KNEE MED (MISCELLANEOUS) ×3 IMPLANT
SLING ARM FOAM STRAP LRG (SOFTGOODS) IMPLANT
SPLINT FAST PLASTER 5X30 (CAST SUPPLIES)
SPLINT PLASTER CAST FAST 5X30 (CAST SUPPLIES) IMPLANT
STOCKINETTE IMPERVIOUS LG (DRAPES) IMPLANT
SUCTION FRAZIER HANDLE 10FR (MISCELLANEOUS)
SUCTION TUBE FRAZIER 10FR DISP (MISCELLANEOUS) IMPLANT
SUT FIBERWIRE #2 38 T-5 BLUE (SUTURE)
SUT MNCRL AB 4-0 PS2 18 (SUTURE) ×6 IMPLANT
SUT PDS AB 1 CT  36 (SUTURE) ×3
SUT PDS AB 1 CT 36 (SUTURE) ×2 IMPLANT
SUT TIGER TAPE 7 IN WHITE (SUTURE) ×3 IMPLANT
SUT VIC AB 0 CT1 27 (SUTURE) ×3
SUT VIC AB 0 CT1 27XBRD ANBCTR (SUTURE) ×2 IMPLANT
SUT VIC AB 0 SH 27 (SUTURE) IMPLANT
SUT VIC AB 3-0 SH 27 (SUTURE) ×3
SUT VIC AB 3-0 SH 27X BRD (SUTURE) ×2 IMPLANT
SUTURE FIBERWR #2 38 T-5 BLUE (SUTURE) IMPLANT
SUTURE TAPE TIGERLINK 1.3MM BL (SUTURE) IMPLANT
SUTURETAPE TIGERLINK 1.3MM BL (SUTURE)
SYR 5ML LL (SYRINGE) ×3 IMPLANT
SYR BULB EAR ULCER 3OZ GRN STR (SYRINGE) ×3 IMPLANT
SYR CONTROL 10ML LL (SYRINGE) IMPLANT
TAPE FIBER 2MM 7IN #2 BLUE (SUTURE) IMPLANT
TOWEL GREEN STERILE FF (TOWEL DISPOSABLE) ×9 IMPLANT
TUBE CONNECTING 20X1/4 (TUBING) ×3 IMPLANT
TUBE SUCTION HIGH CAP CLEAR NV (SUCTIONS) ×3 IMPLANT
TUBING ARTHROSCOPY IRRIG 16FT (MISCELLANEOUS) ×3 IMPLANT

## 2020-05-07 NOTE — Anesthesia Procedure Notes (Signed)
Procedure Name: Intubation Date/Time: 05/07/2020 11:47 AM Performed by: [place] Lib[place] C, CRNA Pre-anesthesia Checklist: Patient identified, Emergency Drugs available, Suction available and Patient being monitored Patient Re-evaluated:Patient Re-evaluated prior to induction Oxygen Delivery Method: Circle system utilized Preoxygenation: Pre-oxygenation with 100% oxygen Induction Type: IV induction Ventilation: Mask ventilation without difficulty Laryngoscope Size: Mac and 4 Grade View: Grade II Tube type: Oral Tube size: 7.5 mm Number of attempts: 1 Airway Equipment and Method: Stylet and Bite block Placement Confirmation: ETT inserted through vocal cords under direct vision,  positive ETCO2 and breath sounds checked- equal and bilateral Secured at: 24 cm Tube secured with: Tape Dental Injury: Teeth and Oropharynx as per pre-operative assessment

## 2020-05-07 NOTE — Anesthesia Postprocedure Evaluation (Signed)
Anesthesia Post Note  Patient: Joshua Brennan  Procedure(s) Performed: LEFT SHOULDER ARTHROSCOPY  DEBRIDEMENT WITH ROTATOR CUFF REPAIR AND SUBACROMIAL DECOMPRESSION PARTIAL ACROMIOPLASTY (Left Shoulder) BICEPS TENODESIS (Left Shoulder) ULNAR NERVE AT ELBOW NEUROPLASTY AND TRANSPOSITION (Left Elbow)     Patient location during evaluation: PACU Anesthesia Type: General Level of consciousness: awake and alert Pain management: pain level controlled Vital Signs Assessment: post-procedure vital signs reviewed and stable Respiratory status: spontaneous breathing, nonlabored ventilation, respiratory function stable and patient connected to nasal cannula oxygen Cardiovascular status: blood pressure returned to baseline and stable Postop Assessment: no apparent nausea or vomiting Anesthetic complications: no   No complications documented.  Last Vitals:  Vitals:   05/07/20 1418 05/07/20 1430  BP:  116/78  Pulse: 74 72  Resp: 15 14  Temp:    SpO2: 100% 100%    Last Pain:  Vitals:   05/07/20 1430  TempSrc:   PainSc: 4                  Arvis Miguez

## 2020-05-07 NOTE — Interval H&P Note (Signed)
All questions answered

## 2020-05-07 NOTE — Discharge Instructions (Signed)
Post Anesthesia Home Care Instructions  Activity: Get plenty of rest for the remainder of the day. A responsible individual must stay with you for 24 hours following the procedure.  For the next 24 hours, DO NOT: -Drive a car -Operate machinery -Drink alcoholic beverages -Take any medication unless instructed by your physician -Make any legal decisions or sign important papers.  Meals: Start with liquid foods such as gelatin or soup. Progress to regular foods as tolerated. Avoid greasy, spicy, heavy foods. If nausea and/or vomiting occur, drink only clear liquids until the nausea and/or vomiting subsides. Call your physician if vomiting continues.  Special Instructions/Symptoms: Your throat may feel dry or sore from the anesthesia or the breathing tube placed in your throat during surgery. If this causes discomfort, gargle with warm salt water. The discomfort should disappear within 24 hours.  If you had a scopolamine patch placed behind your ear for the management of post- operative nausea and/or vomiting:  1. The medication in the patch is effective for 72 hours, after which it should be removed.  Wrap patch in a tissue and discard in the trash. Wash hands thoroughly with soap and water. 2. You may remove the patch earlier than 72 hours if you experience unpleasant side effects which may include dry mouth, dizziness or visual disturbances. 3. Avoid touching the patch. Wash your hands with soap and water after contact with the patch.      Regional Anesthesia Blocks  1. Numbness or the inability to move the "blocked" extremity may last from 3-48 hours after placement. The length of time depends on the medication injected and your individual response to the medication. If the numbness is not going away after 48 hours, call your surgeon.  2. The extremity that is blocked will need to be protected until the numbness is gone and the  Strength has returned. Because you cannot feel it, you  will need to take extra care to avoid injury. Because it may be weak, you may have difficulty moving it or using it. You may not know what position it is in without looking at it while the block is in effect.  3. For blocks in the legs and feet, returning to weight bearing and walking needs to be done carefully. You will need to wait until the numbness is entirely gone and the strength has returned. You should be able to move your leg and foot normally before you try and bear weight or walk. You will need someone to be with you when you first try to ensure you do not fall and possibly risk injury.  4. Bruising and tenderness at the needle site are common side effects and will resolve in a few days.  5. Persistent numbness or new problems with movement should be communicated to the surgeon or the [facility] ([phone])/ [facility] ([phone]).  Information for Discharge Teaching: EXPAREL (bupivacaine liposome injectable suspension)   Your surgeon or anesthesiologist gave you EXPAREL(bupivacaine) to help control your pain after surgery.   EXPAREL is a local anesthetic that provides pain relief by numbing the tissue around the surgical site.  EXPAREL is designed to release pain medication over time and can control pain for up to 72 hours.  Depending on how you respond to EXPAREL, you may require less pain medication during your recovery.  Possible side effects:  Temporary loss of sensation or ability to move in the area where bupivacaine was injected.  Nausea, vomiting, constipation  Rarely,   numbness and tingling in your mouth or lips, lightheadedness, or anxiety may occur.  Call your doctor right away if you think you may be experiencing any of these sensations, or if you have other questions regarding possible side effects.  Follow all other discharge instructions given to you by your surgeon or nurse. Eat a healthy diet and drink plenty of water or other  fluids.  If you return to the hospital for any reason within 96 hours following the administration of EXPAREL, it is important for health care providers to know that you have received this anesthetic. A teal colored band has been placed on your arm with the date, time and amount of EXPAREL you have received in order to alert and inform your health care providers. Please leave this armband in place for the full 96 hours following administration, and then you may remove the band. 

## 2020-05-07 NOTE — Op Note (Signed)
Orthopaedic Surgery Operative Note (CSN: [id number])  Joshua Brennan  07-01-1971 Date of Surgery: 05/07/2020   Diagnoses:  Left shoulder high-grade partial-thickness cuff tear, impingement, biceps tendinitis and left ulnar nerve entrapment at the cubital tunnel  Procedure: Arthroscopic extensive debridement Arthroscopic subacromial decompression Arthroscopic rotator cuff repair Arthroscopic biceps tenodesis Open ulnar nerve decompression and subcutaneous transposition   Operative Finding Exam under anesthesia: Relatively tight and external rotation to about 60 degrees and the extremes of forward elevation past 130 were relatively tight but were able to get him to about 160. Articular space: No loose bodies, capsule was significantly red and inflamed in the anterior interval.  Anterior labral fraying was noted. Chondral surfaces:Intact, no sign of chondral degeneration on the glenoid or humeral head Biceps: Patient had significant redness throughout the biceps in this proximal aspect as well as will be pulled from the groove out into the joint.  The anchor was normal however worried that this chronic biceps tendinosis type change may lead to continued pain.  We thought a biceps tenodesis was in his best interest. Subscapularis: Mild fraying of the superior border which was debrided back Superior Cuff: 90% articular sided high-grade partial-thickness cuff tear. Bursal side: Easily probed through the small layer of intact cuff on the bursal side.  We completed the tear and did a robust repair.  Successful completion of the planned procedure.  Good quality repair and the cuff and the tenodesis.  Ulnar decompression was thorough.  Of note the patient did have some hypotension at the beginning of the case and we laid the patient back down and it recovered.  Anesthesia felt that it was safe for [place] to proceed instead in the setting of his nerve block we felt that it was reasonable to  continue.   Post-operative plan: The patient will be non-weightbearing in a sling for 6 weeks with elbow range of motion and therapy to start for the shoulder after the first visit.  The patient will be discharged home.  DVT prophylaxis not indicated in ambulatory upper extremity patient without known risk factors.   Pain control with PRN pain medication preferring oral medicines.  Follow up plan will be scheduled in approximately 7 days for incision check and XR.  Post-Op Diagnosis: Same Surgeons:Primary: Bjorn Pippin, MD Assistants:Caroline McBane PA-C Location: [facility] OR ROOM 5 Anesthesia: General with Exparel interscalene block Antibiotics: Ancef 2 g with local vancomycin powder 1 g at the surgical site Tourniquet time: None Estimated Blood Loss: Minimal Complications: None Specimens: None Implants: Implant Name Type Inv. Item Serial No. Manufacturer Lot No. LRB No. Used Action  ANCHOR SUT 1.8 FIBERTAK 2 SUT - [id number] Anchor ANCHOR SUT 1.8 FIBERTAK 2 SUT  [employer] [id number] Left 1 Implanted  ANCHOR SUT 1.8 FIBERTAK 2 SUT - [id number] Anchor ANCHOR SUT 1.8 FIBERTAK 2 SUT  [employer] [id number] Left 1 Implanted  ANCHOR SUT BIO SW 4.75X19.1 - [id number] Anchor ANCHOR SUT BIO SW 4.75X19.1  [employer] [id number] Left 1 Implanted  ANCHOR SUT BIO SW 4.75X19.1 - [id number] Anchor ANCHOR SUT BIO SW 4.75X19.1  [employer] [id number] Left 1 Implanted  ANCHOR SUT BIO SW 4.75X19.1 - [id number] Anchor ANCHOR SUT BIO SW 4.75X19.1  Marcie Bal [id number] Left 1 Implanted    Indications for Surgery:   Joshua Brennan is a 49 y.o. male with continued shoulder pain refractory to nonoperative measures for extended period of time.  Patient had findings of a high-grade articular sided cuff tear as well as biceps  tendinitis and additionally concomitant none findings of EMG positive cubital tunnel.  The risks and benefits were explained at length including but not limited to continued pain, cuff failure, biceps  tenodesis failure, stiffness, need for further surgery and infection.  In regards to the cubital tunnel we talked about hematoma formation and continued ulnar nerve paresthesias.   Procedure:   Patient was correctly identified in the preoperative holding area and operative site marked.  Patient brought to OR and positioned beachchair on an [place] table ensuring that all bony prominences were padded and the head was in an appropriate location.  Anesthesia was induced and the operative shoulder was prepped and draped in the usual sterile fashion.  Timeout was called preincision.  A standard posterior viewing portal was made after localizing the portal with a spinal needle.  An anterior accessory portal was also made.  After clearing the articular space the camera was positioned in the subacromial space.  Findings above.    Extensive debridement was performed of the anterior interval tissue, labral fraying and the bursa.  Subacromial decompression: We made a lateral portal with spinal needle guidance. We then proceeded to debride bursal tissue extensively with a shaver and arthrocare device. At that point we continued to identify the borders of the acromion and identify the spur. We then carefully preserved the deltoid fascia and used a burr to convert the acromion to a Type 1 flat acromion without issue.  Biceps tenodesis: We marked the tendon and then performed a tenotomy and debridement of the stump in the articular space. We then identified the biceps tendon in its groove suprapec with the arthroscope in the lateral portal taking care to move from lateral to medial to avoid injury to the subscapularis. At that point we unroofed the tendon itself and mobilized it. An accessory anterior portal was made in line with the tendon and we grasped it from the anterior superior portal and worked from the accessory anterior portal. Two Fibertak 1.42mm knotless anchors were placed in the groove and the tendon was  secured in a luggage loop style fashion with a pass of the limb of suture through the tendon using a scorpion device to avoid pull-through.  Repair was completed with good tension on the tendon.  Residual stump of the tendon was removed after being resected with a RF ablator.  Rotator cuff repair: We identified the partial-thickness tear on the bursal side and were easily able to probe through it.  We completed the tear with a knife and then freshened the tuberosity.  At that point we placed a single 4.75 medial bio composite swivel lock passed the tapes and the stay sutures and tied our safety sutures.  Then we took a limb of tape in a limb of suture each and put them in a lateral row anchor 8 to 10 mm below the lateral border the tuberosity.  With good for purchase with all anchors.  Were careful not to over tension and had a great repair.  We turned our attention to the cubital tunnel.  We placed a Mayo stand on of the elbow and gently position the arm verifying with the scope that we did not tension the cuff.  We began with a 8 cm curvilinear incision over the medial aspect of the elbow just anterior to the epicondyle.  Went through skin sharply achieving hemostasis we progressed.  At that point we identified branches of the medial antebrachial cutaneous nerve were protected and mobilized.  Superficial veins were  protected and mobilized.  We then encountered the cubital tunnel.  We went proximal and found the ulnar nerve proximally near the medial head of the triceps.  We dissected proximally to the arcade of [place] and released this so that we had finger passage easily and the nerve was clearly decompressed in this area.  We then proceeded distally mobilizing the nerve and decompressing as we proceeded.  We are able to mobilize the nerve and preserve the vascular structures around it and place a vessel loop around it to help with mobility.  We were able to decompress it through the cubital tunnel and  identified the branch to the articular surface which was sacrificed and we protected the branches to the FCU.  We split the FCU fascia deep and superficial making sure that we were free and were able to pass a finger deep and much distal to our incision and if the nerve was very free at this point.    At this point the medial antebrachial septum was resected just off the bone taking care to preserve the vascular structure around it.  Once this was complete we were able to mobilize the nerve and move it anterior.  We used a 0 Vicryl and very loosely were able to interposed soft tissue to avoid the nerve subluxating back into its groove.  There is no tension on the nerve after this.  We took final assessment of the nerve and it was free proximal and distal as well as in its subcutaneous position no tension through gentle range of motion of the elbow.  We irrigated copiously and then placed our local antibiotics.  We did not place a splint as the patient is going to be in a sling due to his rotator cuff repair regardless.   The incisions were closed with absorbable monocryl and steri strips.  A sterile dressing was placed along with a sling. The patient was awoken from general anesthesia and taken to the PACU in stable condition without complication.   Alfonse Alpers, PA-C, present and scrubbed throughout the case, critical for completion in a timely fashion, and for retraction, instrumentation, closure.

## 2020-05-07 NOTE — Anesthesia Preprocedure Evaluation (Signed)
Anesthesia Evaluation  Patient identified by MRN, date of birth, ID band Patient awake    Reviewed: Allergy & Precautions, H&P , NPO status , Patient's Chart, lab work & pertinent test results, reviewed documented beta blocker date and time   Airway Mallampati: II  TM Distance: >3 FB Neck ROM: full    Dental no notable dental hx.    Pulmonary neg pulmonary ROS, Current Smoker,    Pulmonary exam normal breath sounds clear to auscultation       Cardiovascular Exercise Tolerance: Good negative cardio ROS   Rhythm:regular Rate:Normal     Neuro/Psych PSYCHIATRIC DISORDERS Anxiety Depression  Neuromuscular disease    GI/Hepatic negative GI ROS, Neg liver ROS,   Endo/Other  negative endocrine ROS  Renal/GU negative Renal ROS  negative genitourinary   Musculoskeletal   Abdominal   Peds  Hematology negative hematology ROS (+)   Anesthesia Other Findings   Reproductive/Obstetrics negative OB ROS                             Anesthesia Physical Anesthesia Plan  ASA: II  Anesthesia Plan: General   Post-op Pain Management: GA combined w/ Regional for post-op pain   Induction:   PONV Risk Score and Plan: 2 and Ondansetron and Dexamethasone  Airway Management Planned: Oral ETT and LMA  Additional Equipment:   Intra-op Plan:   Post-operative Plan: Extubation in OR  Informed Consent: I have reviewed the patients History and Physical, chart, labs and discussed the procedure including the risks, benefits and alternatives for the proposed anesthesia with the patient or authorized representative who has indicated his/her understanding and acceptance.     Dental Advisory Given  Plan Discussed with: CRNA and Anesthesiologist  Anesthesia Plan Comments: (Discussed both nerve block for pain relief post-op and GA; including NV, sore throat, dental injury, and pulmonary complications)         Anesthesia Quick Evaluation

## 2020-05-07 NOTE — Progress Notes (Signed)
Assisted Dr. Oddono with left, ultrasound guided, interscalene  block. Side rails up, monitors on throughout procedure. See vital signs in flow sheet. Tolerated Procedure well. 

## 2020-05-07 NOTE — Anesthesia Procedure Notes (Signed)
Anesthesia Regional Block: Interscalene brachial plexus block   Pre-Anesthetic Checklist: ,, timeout performed, Correct Patient, Correct Site, Correct Laterality, Correct Procedure, Correct Position, site marked, Risks and benefits discussed,  Surgical consent,  Pre-op evaluation,  At surgeon's request and post-op pain management  Laterality: Left  Prep: chloraprep       Needles:  Injection technique: Single-shot  Needle Type: Echogenic Stimulator Needle     Needle Length: 5cm  Needle Gauge: 22     Additional Needles:   Procedures:, nerve stimulator,,, ultrasound used (permanent image in chart),,,,   Nerve Stimulator or Paresthesia:  Response: hand, 0.45 mA,   Additional Responses:   Narrative:  Start time: 05/07/2020 10:18 AM End time: 05/07/2020 10:22 AM Injection made incrementally with aspirations every 5 mL.  Performed by: Personally  Anesthesiologist: Bethena Midget, MD  Additional Notes: Functioning IV was confirmed and monitors were applied.  A 69mm 22ga Arrow echogenic stimulator needle was used. Sterile prep and drape,hand hygiene and sterile gloves were used. Ultrasound guidance: relevant anatomy identified, needle position confirmed, local anesthetic spread visualized around nerve(s)., vascular puncture avoided.  Image printed for medical record. Negative aspiration and negative test dose prior to incremental administration of local anesthetic. The patient tolerated the procedure well.

## 2020-05-07 NOTE — Transfer of Care (Signed)
Immediate Anesthesia Transfer of Care Note  Patient: Joshua Brennan  Procedure(s) Performed: LEFT SHOULDER ARTHROSCOPY  DEBRIDEMENT WITH ROTATOR CUFF REPAIR AND SUBACROMIAL DECOMPRESSION PARTIAL ACROMIOPLASTY (Left Shoulder) BICEPS TENODESIS (Left Shoulder) ULNAR NERVE AT ELBOW NEUROPLASTY AND TRANSPOSITION (Left Elbow)  Patient Location: PACU  Anesthesia Type:GA combined with regional for post-op pain  Level of Consciousness: awake, alert  and oriented  Airway & Oxygen Therapy: Patient Spontanous Breathing and Patient connected to face mask oxygen  Post-op Assessment: Report given to RN and Post -op Vital signs reviewed and stable  Post vital signs: Reviewed and stable  Last Vitals:  Vitals Value Taken Time  BP 121/80 05/07/20 1352  Temp    Pulse 81 05/07/20 1356  Resp 14 05/07/20 1356  SpO2 100 % 05/07/20 1356  Vitals shown include unvalidated device data.  Last Pain:  Vitals:   05/07/20 1016  TempSrc:   PainSc: 0-No pain         Complications: No complications documented.

## 2020-05-08 ENCOUNTER — Encounter (HOSPITAL_BASED_OUTPATIENT_CLINIC_OR_DEPARTMENT_OTHER): Payer: Self-pay | Admitting: Orthopaedic Surgery

## 2020-05-16 DIAGNOSIS — M25512 Pain in left shoulder: Secondary | ICD-10-CM | POA: Diagnosis not present

## 2020-05-17 ENCOUNTER — Ambulatory Visit: Payer: Self-pay

## 2020-05-20 ENCOUNTER — Emergency Department (HOSPITAL_COMMUNITY): Payer: BC Managed Care – PPO

## 2020-05-20 ENCOUNTER — Other Ambulatory Visit: Payer: Self-pay

## 2020-05-20 ENCOUNTER — Encounter (HOSPITAL_COMMUNITY): Payer: Self-pay | Admitting: Emergency Medicine

## 2020-05-20 ENCOUNTER — Emergency Department (HOSPITAL_COMMUNITY)
Admission: EM | Admit: 2020-05-20 | Discharge: 2020-05-20 | Disposition: A | Discharge status: Home / Self Care | Payer: BC Managed Care – PPO | Attending: Emergency Medicine | Admitting: Emergency Medicine

## 2020-05-20 DIAGNOSIS — F17210 Nicotine dependence, cigarettes, uncomplicated: Secondary | ICD-10-CM | POA: Diagnosis not present

## 2020-05-20 DIAGNOSIS — R6884 Jaw pain: Secondary | ICD-10-CM | POA: Insufficient documentation

## 2020-05-20 DIAGNOSIS — R519 Headache, unspecified: Secondary | ICD-10-CM | POA: Insufficient documentation

## 2020-05-20 DIAGNOSIS — R1084 Generalized abdominal pain: Secondary | ICD-10-CM | POA: Insufficient documentation

## 2020-05-20 DIAGNOSIS — R109 Unspecified abdominal pain: Secondary | ICD-10-CM | POA: Diagnosis not present

## 2020-05-20 LAB — URINALYSIS, ROUTINE W REFLEX MICROSCOPIC
Bilirubin Urine: NEGATIVE
Glucose, UA: NEGATIVE mg/dL
Hgb urine dipstick: NEGATIVE
Ketones, ur: NEGATIVE mg/dL
Leukocytes,Ua: NEGATIVE
Nitrite: NEGATIVE
Protein, ur: NEGATIVE mg/dL
Specific Gravity, Urine: 1.032 — ABNORMAL HIGH (ref 1.005–1.030)
pH: 6 (ref 5.0–8.0)

## 2020-05-20 LAB — CBC WITH DIFFERENTIAL/PLATELET
Abs Immature Granulocytes: 0.01 10*3/uL (ref 0.00–0.07)
Basophils Absolute: 0.1 10*3/uL (ref 0.0–0.1)
Basophils Relative: 1 %
Eosinophils Absolute: 0.1 10*3/uL (ref 0.0–0.5)
Eosinophils Relative: 2 %
HCT: 41.7 % (ref 39.0–52.0)
Hemoglobin: 14.1 g/dL (ref 13.0–17.0)
Immature Granulocytes: 0 %
Lymphocytes Relative: 24 %
Lymphs Abs: 2.1 10*3/uL (ref 0.7–4.0)
MCH: 28.7 pg (ref 26.0–34.0)
MCHC: 33.8 g/dL (ref 30.0–36.0)
MCV: 84.8 fL (ref 80.0–100.0)
Monocytes Absolute: 0.8 10*3/uL (ref 0.1–1.0)
Monocytes Relative: 9 %
Neutro Abs: 5.5 10*3/uL (ref 1.7–7.7)
Neutrophils Relative %: 64 %
Platelets: 257 10*3/uL (ref 150–400)
RBC: 4.92 MIL/uL (ref 4.22–5.81)
RDW: 12.4 % (ref 11.5–15.5)
WBC: 8.5 10*3/uL (ref 4.0–10.5)
nRBC: 0 % (ref 0.0–0.2)

## 2020-05-20 LAB — COMPREHENSIVE METABOLIC PANEL
ALT: 18 U/L (ref 0–44)
AST: 17 U/L (ref 15–41)
Albumin: 3.8 g/dL (ref 3.5–5.0)
Alkaline Phosphatase: 78 U/L (ref 38–126)
Anion gap: 6 (ref 5–15)
BUN: 13 mg/dL (ref 6–20)
CO2: 26 mmol/L (ref 22–32)
Calcium: 8.7 mg/dL — ABNORMAL LOW (ref 8.9–10.3)
Chloride: 107 mmol/L (ref 98–111)
Creatinine, Ser: 0.99 mg/dL (ref 0.61–1.24)
GFR, Estimated: >60 mL/min (ref >60)
Glucose, Bld: 109 mg/dL — ABNORMAL HIGH (ref 70–99)
Potassium: 4.2 mmol/L (ref 3.5–5.1)
Sodium: 139 mmol/L (ref 135–145)
Total Bilirubin: 0.6 mg/dL (ref 0.3–1.2)
Total Protein: 6.8 g/dL (ref 6.5–8.1)

## 2020-05-20 MED ORDER — PANTOPRAZOLE SODIUM 20 MG PO TBEC: 20.0000 mg | DELAYED_RELEASE_TABLET | Freq: Every day | ORAL | 0 refills | Status: DC

## 2020-05-20 MED ORDER — LACTATED RINGERS IV BOLUS
1000.0000 mL | Freq: Once | INTRAVENOUS | Status: AC
  Administered 2020-05-20: 1000 mL via INTRAVENOUS

## 2020-05-20 MED ORDER — PANTOPRAZOLE SODIUM 40 MG IV SOLR
40.0000 mg | Freq: Once | INTRAVENOUS | Status: AC
  Administered 2020-05-20: 40 mg via INTRAVENOUS
  Filled 2020-05-20: qty 40

## 2020-05-20 MED ORDER — SODIUM CHLORIDE 0.9 % IV BOLUS
1000.0000 mL | Freq: Once | INTRAVENOUS | Status: AC
  Administered 2020-05-20: 1000 mL via INTRAVENOUS

## 2020-05-20 MED ORDER — IOHEXOL 300 MG/ML  SOLN
100.0000 mL | Freq: Once | INTRAMUSCULAR | Status: AC | PRN
  Administered 2020-05-20: 100 mL via INTRAVENOUS

## 2020-05-20 NOTE — ED Triage Notes (Signed)
Pt c/o headaches that always get worse at night. Pt also c/o abd pain that has been going since July.

## 2020-05-20 NOTE — ED Provider Notes (Signed)
[facility] EMERGENCY DEPARTMENT Provider Note   CSN: [id number] Arrival date & time: 05/20/20  0057     History Chief Complaint  Patient presents with  . Headache  . Abdominal Pain    Joshua Brennan is a 49 y.o. male.   Headache Pain location:  Generalized Quality:  Dull Onset quality:  Gradual Timing:  Constant Chronicity:  Recurrent Similar to prior headaches: no   Context comment:  At night Associated symptoms: abdominal pain   Abdominal Pain      Past Medical History:  Diagnosis Date  . Anxiety   . Dizziness   . ED (erectile dysfunction)   . Tremor   . Vertigo     Patient Active Problem List   Diagnosis Date Noted  . Hematemesis 04/02/2020  . Loss of weight 04/02/2020  . Neuropathy of left ulnar nerve at wrist 01/02/2020  . Depression 01/02/2020  . Autonomic disorder 12/06/2019    Past Surgical History:  Procedure Laterality Date  . BICEPT TENODESIS Left 05/07/2020   Procedure: BICEPS TENODESIS;  Surgeon: Hiram Gash, MD;  Location: [place];  Service: Orthopedics;  Laterality: Left;  . INGUINAL HERNIA REPAIR  1990  . KNEE ARTHROSCOPY Right 1987  . SHOULDER ARTHROSCOPY WITH ROTATOR CUFF REPAIR AND SUBACROMIAL DECOMPRESSION Left 05/07/2020   Procedure: LEFT SHOULDER ARTHROSCOPY  DEBRIDEMENT WITH ROTATOR CUFF REPAIR AND SUBACROMIAL DECOMPRESSION PARTIAL ACROMIOPLASTY;  Surgeon: Hiram Gash, MD;  Location: [place];  Service: Orthopedics;  Laterality: Left;  . ULNAR NERVE TRANSPOSITION Left 05/07/2020   Procedure: ULNAR NERVE AT ELBOW NEUROPLASTY AND TRANSPOSITION;  Surgeon: Hiram Gash, MD;  Location: [place];  Service: Orthopedics;  Laterality: Left;       Family History  Problem Relation Age of Onset  . Diabetes Mother   . Pneumonia Father   . Diabetes Maternal Grandmother   . Heart attack Maternal Uncle 40    Social History   Tobacco Use  . Smoking status: Current Every Day Smoker     Packs/day: 1.00    Types: Cigarettes  . Smokeless tobacco: Never Used  Vaping Use  . Vaping Use: Never used  Substance Use Topics  . Alcohol use: Not Currently  . Drug use: Yes    Types: Marijuana    Comment: sometimes    Home Medications Prior to Admission medications   Medication Sig Start Date End Date Taking? Authorizing Provider  acetaminophen (TYLENOL) 500 MG tablet Take 2 tablets (1,000 mg total) by mouth every 8 (eight) hours for 14 days. 05/07/20 05/21/20  Ethelda Chick, PA-C  celecoxib (CELEBREX) 100 MG capsule Take 1 capsule (100 mg total) by mouth 2 (two) times daily. 05/07/20 06/06/20  Ethelda Chick, PA-C  meclizine (ANTIVERT) 25 MG tablet Take 25 mg by mouth 3 (three) times daily as needed for dizziness.    [provider]  pantoprazole (PROTONIX) 20 MG tablet Take 1 tablet (20 mg total) by mouth daily. 05/20/20   Dianna Deshler, Corene Cornea, MD  polyethylene glycol (MIRALAX) 17 g packet Take 17 g by mouth at bedtime as needed. 04/02/20   Noralyn Pick, NP  sildenafil (VIAGRA) 50 MG tablet Take 50 mg by mouth daily as needed for erectile dysfunction.    [provider]  SUPREP BOWEL PREP KIT 17.5-3.13-1.6 GM/177ML SOLN Suprep-Use as directed 04/02/20   Noralyn Pick, NP    Allergies    Patient has no known allergies.  Review of Systems  Review of Systems  Gastrointestinal: Positive for abdominal pain.  Neurological: Positive for headaches.  All other systems reviewed and are negative.   Physical Exam Updated Vital Signs BP 129/84   Pulse 73   Temp 98.3 F (36.8 C) (Oral)   Resp 16   SpO2 99%   Physical Exam Vitals and nursing note reviewed.  Constitutional:      Appearance: He is well-developed.  HENT:     Head: Normocephalic and atraumatic.     Mouth/Throat:     Mouth: Mucous membranes are moist.     Pharynx: Oropharynx is clear.  Eyes:     Pupils: Pupils are equal, round, and reactive to light.  Cardiovascular:      Rate and Rhythm: Normal rate.  Pulmonary:     Effort: Pulmonary effort is normal. No respiratory distress.  Abdominal:     General: There is no distension.     Palpations: Abdomen is soft.     Hernia: No hernia is present.  Musculoskeletal:        General: Normal range of motion.     Cervical back: Normal range of motion.  Skin:    General: Skin is warm and dry.  Neurological:     General: No focal deficit present.     Mental Status: He is alert.     Cranial Nerves: No cranial nerve deficit.     Sensory: No sensory deficit.     Motor: No weakness.     Coordination: Romberg sign negative. Coordination normal.     Gait: Gait normal.     ED Results / Procedures / Treatments   Labs (all labs ordered are listed, but only abnormal results are displayed) Labs Reviewed  COMPREHENSIVE METABOLIC PANEL - Abnormal; Notable for the following components:      Result Value   Glucose, Bld 109 (*)    Calcium 8.7 (*)    All other components within normal limits  URINALYSIS, ROUTINE W REFLEX MICROSCOPIC - Abnormal; Notable for the following components:   Specific Gravity, Urine 1.032 (*)    All other components within normal limits  CBC WITH DIFFERENTIAL/PLATELET    EKG None  Radiology CT ABDOMEN PELVIS W CONTRAST  Result Date: 05/20/2020 CLINICAL DATA:  Upper abdominal pain since July. EXAM: CT ABDOMEN AND PELVIS WITH CONTRAST TECHNIQUE: Multidetector CT imaging of the abdomen and pelvis was performed using the standard protocol following bolus administration of intravenous contrast. CONTRAST:  1104m OMNIPAQUE IOHEXOL 300 MG/ML  SOLN COMPARISON:  None. FINDINGS: Lower chest: The visualized lung bases are clear. Hepatobiliary: No focal liver abnormality is seen. No gallstones, gallbladder wall thickening, or biliary dilatation. Pancreas: Unremarkable. Spleen: Unremarkable. Adrenals/Urinary Tract: Unremarkable adrenal glands. Hypodensities in the kidneys measuring up to 1 cm in size,  likely cysts though most are too small to fully characterize. No renal calculi or hydronephrosis. Two punctate calculi posterior in the bladder adjacent to the right UVJ. No ureteral calculi or ureteral dilatation. Stomach/Bowel: The stomach is unremarkable. There is a moderate amount of stool in the colon and rectum. There is no evidence of bowel obstruction or inflammation. The appendix is unremarkable. Vascular/Lymphatic: Mild abdominal aortic atherosclerosis without aneurysm. No enlarged lymph nodes. Reproductive: Coarse calcification centrally in the prostate. Other: No ascites or pneumoperitoneum. Musculoskeletal: No acute osseous abnormality or suspicious osseous lesion. IMPRESSION: 1. No acute abnormality identified in the abdomen or pelvis. 2. Punctate bladder calculi. 3. Moderate colonic stool burden. 4. Aortic Atherosclerosis (ICD10-I70.0). Electronically Signed   By:  Logan Bores M.D.   On: 05/20/2020 04:57    Procedures Procedures (including critical care time)  Medications Ordered in ED Medications  lactated ringers bolus 1,000 mL (0 mLs Intravenous Stopped 05/20/20 0459)  pantoprazole (PROTONIX) injection 40 mg (40 mg Intravenous Given 05/20/20 0334)  iohexol (OMNIPAQUE) 300 MG/ML solution 100 mL (100 mLs Intravenous Contrast Given 05/20/20 0357)  sodium chloride 0.9 % bolus 1,000 mL (0 mLs Intravenous Stopped 05/20/20 0555)    ED Course  I have reviewed the triage vital signs and the nursing notes.  Pertinent labs & imaging results that were available during my care of the patient were reviewed by me and considered in my medical decision making (see chart for details).    MDM Rules/Calculators/A&P                          Likely clenching jaw causing night time headaches, MRI recently without cause.   Abdomen and light headedness likely related to indigestion causing dehydration and light headedness. Ct ok. Will start on meds for same. GI/PCP fu for further workup.  Final  Clinical Impression(s) / ED Diagnoses Final diagnoses:  Generalized abdominal pain  Nonintractable headache, unspecified chronicity pattern, unspecified headache type  Jaw pain    Rx / DC Orders ED Discharge Orders         Ordered    pantoprazole (PROTONIX) 20 MG tablet  Daily        05/20/20 0522           Artisha Capri, Corene Cornea, MD 05/20/20 [phone]

## 2020-05-23 DIAGNOSIS — M75121 Complete rotator cuff tear or rupture of right shoulder, not specified as traumatic: Secondary | ICD-10-CM | POA: Diagnosis not present

## 2020-05-23 DIAGNOSIS — M6281 Muscle weakness (generalized): Secondary | ICD-10-CM | POA: Diagnosis not present

## 2020-05-23 DIAGNOSIS — M25511 Pain in right shoulder: Secondary | ICD-10-CM | POA: Diagnosis not present

## 2020-05-23 DIAGNOSIS — M25611 Stiffness of right shoulder, not elsewhere classified: Secondary | ICD-10-CM | POA: Diagnosis not present

## 2020-05-26 DIAGNOSIS — M6281 Muscle weakness (generalized): Secondary | ICD-10-CM | POA: Diagnosis not present

## 2020-05-26 DIAGNOSIS — M75121 Complete rotator cuff tear or rupture of right shoulder, not specified as traumatic: Secondary | ICD-10-CM | POA: Diagnosis not present

## 2020-05-26 DIAGNOSIS — M25511 Pain in right shoulder: Secondary | ICD-10-CM | POA: Diagnosis not present

## 2020-05-26 DIAGNOSIS — M25611 Stiffness of right shoulder, not elsewhere classified: Secondary | ICD-10-CM | POA: Diagnosis not present

## 2020-05-27 ENCOUNTER — Telehealth: Payer: Self-pay | Admitting: Gastroenterology

## 2020-05-27 NOTE — Telephone Encounter (Signed)
Spoke with the patient. Advised to fast. No prep solution.

## 2020-05-27 NOTE — Telephone Encounter (Signed)
Pt is requesting a call back from a nurse to discuss his recent change from Colonoscopy/EGD to just and EGD, pt would like to know if this changes the prep he has to do.

## 2020-05-28 DIAGNOSIS — M25611 Stiffness of right shoulder, not elsewhere classified: Secondary | ICD-10-CM | POA: Diagnosis not present

## 2020-05-28 DIAGNOSIS — M25511 Pain in right shoulder: Secondary | ICD-10-CM | POA: Diagnosis not present

## 2020-05-28 DIAGNOSIS — M6281 Muscle weakness (generalized): Secondary | ICD-10-CM | POA: Diagnosis not present

## 2020-05-28 DIAGNOSIS — M75121 Complete rotator cuff tear or rupture of right shoulder, not specified as traumatic: Secondary | ICD-10-CM | POA: Diagnosis not present

## 2020-05-30 ENCOUNTER — Encounter: Payer: BC Managed Care – PPO | Admitting: Gastroenterology

## 2020-06-02 DIAGNOSIS — M6281 Muscle weakness (generalized): Secondary | ICD-10-CM | POA: Diagnosis not present

## 2020-06-02 DIAGNOSIS — M25611 Stiffness of right shoulder, not elsewhere classified: Secondary | ICD-10-CM | POA: Diagnosis not present

## 2020-06-02 DIAGNOSIS — M25511 Pain in right shoulder: Secondary | ICD-10-CM | POA: Diagnosis not present

## 2020-06-02 DIAGNOSIS — M75121 Complete rotator cuff tear or rupture of right shoulder, not specified as traumatic: Secondary | ICD-10-CM | POA: Diagnosis not present

## 2020-06-04 DIAGNOSIS — M25511 Pain in right shoulder: Secondary | ICD-10-CM | POA: Diagnosis not present

## 2020-06-04 DIAGNOSIS — M25611 Stiffness of right shoulder, not elsewhere classified: Secondary | ICD-10-CM | POA: Diagnosis not present

## 2020-06-04 DIAGNOSIS — M6281 Muscle weakness (generalized): Secondary | ICD-10-CM | POA: Diagnosis not present

## 2020-06-04 DIAGNOSIS — M75121 Complete rotator cuff tear or rupture of right shoulder, not specified as traumatic: Secondary | ICD-10-CM | POA: Diagnosis not present

## 2020-06-06 ENCOUNTER — Other Ambulatory Visit: Payer: Self-pay | Admitting: Gastroenterology

## 2020-06-06 ENCOUNTER — Ambulatory Visit (INDEPENDENT_AMBULATORY_CARE_PROVIDER_SITE_OTHER): Payer: BC Managed Care – PPO

## 2020-06-06 DIAGNOSIS — Z1159 Encounter for screening for other viral diseases: Secondary | ICD-10-CM | POA: Diagnosis not present

## 2020-06-06 LAB — SARS CORONAVIRUS 2 (TAT 6-24 HRS): SARS Coronavirus 2: NEGATIVE

## 2020-06-09 DIAGNOSIS — M25511 Pain in right shoulder: Secondary | ICD-10-CM | POA: Diagnosis not present

## 2020-06-09 DIAGNOSIS — M75121 Complete rotator cuff tear or rupture of right shoulder, not specified as traumatic: Secondary | ICD-10-CM | POA: Diagnosis not present

## 2020-06-09 DIAGNOSIS — R262 Difficulty in walking, not elsewhere classified: Secondary | ICD-10-CM | POA: Diagnosis not present

## 2020-06-09 DIAGNOSIS — M25611 Stiffness of right shoulder, not elsewhere classified: Secondary | ICD-10-CM | POA: Diagnosis not present

## 2020-06-10 ENCOUNTER — Ambulatory Visit (AMBULATORY_SURGERY_CENTER): Payer: BC Managed Care – PPO | Admitting: Gastroenterology

## 2020-06-10 ENCOUNTER — Encounter: Payer: Self-pay | Admitting: Gastroenterology

## 2020-06-10 ENCOUNTER — Other Ambulatory Visit: Payer: Self-pay

## 2020-06-10 VITALS — BP 121/72 | HR 70 | Temp 97.1°F | Resp 13 | Ht 71.0 in | Wt 166.0 lb

## 2020-06-10 DIAGNOSIS — R1084 Generalized abdominal pain: Secondary | ICD-10-CM | POA: Diagnosis not present

## 2020-06-10 DIAGNOSIS — K2970 Gastritis, unspecified, without bleeding: Secondary | ICD-10-CM | POA: Diagnosis not present

## 2020-06-10 DIAGNOSIS — B9681 Helicobacter pylori [H. pylori] as the cause of diseases classified elsewhere: Secondary | ICD-10-CM

## 2020-06-10 DIAGNOSIS — K208 Other esophagitis without bleeding: Secondary | ICD-10-CM | POA: Diagnosis not present

## 2020-06-10 DIAGNOSIS — K920 Hematemesis: Secondary | ICD-10-CM

## 2020-06-10 DIAGNOSIS — K2951 Unspecified chronic gastritis with bleeding: Secondary | ICD-10-CM | POA: Diagnosis not present

## 2020-06-10 MED ORDER — SODIUM CHLORIDE 0.9 % IV SOLN: 500.0000 mL | Freq: Once | INTRAVENOUS | Status: DC

## 2020-06-10 MED ORDER — OMEPRAZOLE 40 MG PO CPDR
DELAYED_RELEASE_CAPSULE | ORAL | 11 refills | Status: DC
Start: 2020-06-10 — End: 2023-03-17

## 2020-06-10 NOTE — Op Note (Signed)
[facility] Patient Name: Joshua Brennan Procedure Date: 06/10/2020 9:29 AM MRN: [id number] Endoscopist: Rachael Fee , MD Age: 48 Referring MD:  Date of Birth: 03/06/1971 Gender: Male Account #: [id number] Procedure:                Upper GI endoscopy Indications:              Dyspepsia, early satiety, weight loss                            (unintentional) Medicines:                Monitored Anesthesia Care Procedure:                Pre-Anesthesia Assessment:                           - Prior to the procedure, a History and Physical                            was performed, and patient medications and                            allergies were reviewed. The patient's tolerance of                            previous anesthesia was also reviewed. The risks                            and benefits of the procedure and the sedation                            options and risks were discussed with the patient.                            All questions were answered, and informed consent                            was obtained. Prior Anticoagulants: The patient has                            taken no previous anticoagulant or antiplatelet                            agents. ASA Grade Assessment: II - A patient with                            mild systemic disease. After reviewing the risks                            and benefits, the patient was deemed in                            satisfactory condition to undergo the procedure.  After obtaining informed consent, the endoscope was                            passed under direct vision. Throughout the                            procedure, the patient's blood pressure, pulse, and                            oxygen saturations were monitored continuously. The                            Endoscope was introduced through the mouth, and                            advanced to the second part of duodenum. The upper                             GI endoscopy was accomplished without difficulty.                            The patient tolerated the procedure well. Scope In: Scope Out: Findings:                 Erosive esophagitis with two typical appearing                            spokes of inflammation starting at GE junction.                           Moderate inflammation characterized by erythema,                            friability and granularity was found in the gastric                            antrum. Biopsies were taken with a cold forceps for                            histology.                           The exam was otherwise without abnormality. Complications:            No immediate complications. Estimated blood loss:                            None. Estimated Blood Loss:     Estimated blood loss: none. Impression:               - Erosive esophagitis, very likely acid related.                           - Moderate, non-specific gastritis. Biopsied to  check for H. pylori.                           - The examination was otherwise normal. Recommendation:           - Patient has a contact number available for                            emergencies. The signs and symptoms of potential                            delayed complications were discussed with the                            patient. Return to normal activities tomorrow.                            Written discharge instructions were provided to the                            patient.                           - Resume previous diet.                           - Continue present medications. New start                            omeprazole 40mg  pills, one pill 20-30 min before                            your first meal of the day, disp 30 with 11 refills                           - Await pathology results. Will start approrpriate                            antibiotics if + for H. pylori. Will likely need                             further testing if negative for H. pylori (GES?) , MD 06/10/2020 9:48:17 AM This report has been signed electronically.

## 2020-06-10 NOTE — Progress Notes (Signed)
Report to PACU, RN, vss, BBS= Clear.  

## 2020-06-10 NOTE — Progress Notes (Signed)
Called to room to assist during endoscopic procedure.  Patient ID and intended procedure confirmed with present staff. Received instructions for my participation in the procedure from the performing physician.  

## 2020-06-10 NOTE — Progress Notes (Signed)
VS- 

## 2020-06-10 NOTE — Patient Instructions (Signed)
Start Omeprazole 40 mg one daily ( take 20-30 minutes before 1st meal of the day)- this should heal erosive esophagitis seen today  Await pathology results on biopsies done   Handout on esophagitis given to you today    YOU HAD AN ENDOSCOPIC PROCEDURE TODAY AT THE [facility]:   Refer to the procedure report that was given to you for any specific questions about what was found during the examination.  If the procedure report does not answer your questions, please call your gastroenterologist to clarify.  If you requested that your care partner not be given the details of your procedure findings, then the procedure report has been included in a sealed envelope for you to review at your convenience later.  YOU SHOULD EXPECT: Some feelings of bloating in the abdomen. Passage of more gas than usual.  Walking can help get rid of the air that was put into your GI tract during the procedure and reduce the bloating. If you had a lower endoscopy (such as a colonoscopy or flexible sigmoidoscopy) you may notice spotting of blood in your stool or on the toilet paper. If you underwent a bowel prep for your procedure, you may not have a normal bowel movement for a few days.  Please Note:  You might notice some irritation and congestion in your nose or some drainage.  This is from the oxygen used during your procedure.  There is no need for concern and it should clear up in a day or so.  SYMPTOMS TO REPORT IMMEDIATELY:    Following upper endoscopy (EGD)  Vomiting of blood or coffee ground material  New chest pain or pain under the shoulder blades  Painful or persistently difficult swallowing  New shortness of breath  Fever of 100F or higher  Black, tarry-looking stools  For urgent or emergent issues, a gastroenterologist can be reached at any hour by calling [phone]. Do not use MyChart messaging for urgent concerns.    DIET:  We do recommend a small meal at first, but then you  may proceed to your regular diet.  Drink plenty of fluids but you should avoid alcoholic beverages for 24 hours.  ACTIVITY:  You should plan to take it easy for the rest of today and you should NOT DRIVE or use heavy machinery until tomorrow (because of the sedation medicines used during the test).    FOLLOW UP: Our staff will call the number listed on your records 48-72 hours following your procedure to check on you and address any questions or concerns that you may have regarding the information given to you following your procedure. If we do not reach you, we will leave a message.  We will attempt to reach you two times.  During this call, we will ask if you have developed any symptoms of COVID 19. If you develop any symptoms (ie: fever, flu-like symptoms, shortness of breath, cough etc.) before then, please call [phone].  If you test positive for Covid 19 in the 2 weeks post procedure, please call and report this information to [place].    If any biopsies were taken you will be contacted by phone or by letter within the next 1-3 weeks.  Please call us at [phone] if you have not heard about the biopsies in 3 weeks.    SIGNATURES/CONFIDENTIALITY: You and/or your care partner have signed paperwork which will be entered into your electronic medical record.  These signatures attest to the fact that that  the information above on your After Visit Summary has been reviewed and is understood.  Full responsibility of the confidentiality of this discharge information lies with you and/or your care-partner.

## 2020-06-11 DIAGNOSIS — M25511 Pain in right shoulder: Secondary | ICD-10-CM | POA: Diagnosis not present

## 2020-06-11 DIAGNOSIS — M75121 Complete rotator cuff tear or rupture of right shoulder, not specified as traumatic: Secondary | ICD-10-CM | POA: Diagnosis not present

## 2020-06-11 DIAGNOSIS — M25611 Stiffness of right shoulder, not elsewhere classified: Secondary | ICD-10-CM | POA: Diagnosis not present

## 2020-06-11 DIAGNOSIS — M6281 Muscle weakness (generalized): Secondary | ICD-10-CM | POA: Diagnosis not present

## 2020-06-12 ENCOUNTER — Telehealth: Payer: Self-pay

## 2020-06-12 ENCOUNTER — Telehealth: Payer: Self-pay | Admitting: *Deleted

## 2020-06-12 NOTE — Telephone Encounter (Signed)
°  Follow up Call-  Call back number 06/10/2020  Post procedure Call Back phone  # [phone]  Permission to leave phone message Yes  Some recent data might be hidden     Patient questions:  Message left to call us if necessary.  Second call.

## 2020-06-12 NOTE — Telephone Encounter (Signed)
LvM

## 2020-06-16 DIAGNOSIS — M75121 Complete rotator cuff tear or rupture of right shoulder, not specified as traumatic: Secondary | ICD-10-CM | POA: Diagnosis not present

## 2020-06-16 DIAGNOSIS — M25511 Pain in right shoulder: Secondary | ICD-10-CM | POA: Diagnosis not present

## 2020-06-16 DIAGNOSIS — M25611 Stiffness of right shoulder, not elsewhere classified: Secondary | ICD-10-CM | POA: Diagnosis not present

## 2020-06-16 DIAGNOSIS — M6281 Muscle weakness (generalized): Secondary | ICD-10-CM | POA: Diagnosis not present

## 2020-06-18 ENCOUNTER — Other Ambulatory Visit: Payer: Self-pay

## 2020-06-18 DIAGNOSIS — M25611 Stiffness of right shoulder, not elsewhere classified: Secondary | ICD-10-CM | POA: Diagnosis not present

## 2020-06-18 DIAGNOSIS — M6281 Muscle weakness (generalized): Secondary | ICD-10-CM | POA: Diagnosis not present

## 2020-06-18 DIAGNOSIS — M75121 Complete rotator cuff tear or rupture of right shoulder, not specified as traumatic: Secondary | ICD-10-CM | POA: Diagnosis not present

## 2020-06-18 DIAGNOSIS — M25511 Pain in right shoulder: Secondary | ICD-10-CM | POA: Diagnosis not present

## 2020-06-18 MED ORDER — METRONIDAZOLE 500 MG PO TABS: 500.0000 mg | ORAL_TABLET | Freq: Two times a day (BID) | ORAL | 0 refills | Status: AC

## 2020-06-18 MED ORDER — CLARITHROMYCIN 500 MG PO TABS: 500.0000 mg | ORAL_TABLET | Freq: Two times a day (BID) | ORAL | 0 refills | Status: AC

## 2020-06-18 MED ORDER — AMOXICILLIN 500 MG PO TABS: 1000.0000 mg | ORAL_TABLET | Freq: Two times a day (BID) | ORAL | 0 refills | Status: AC

## 2020-06-23 DIAGNOSIS — M75121 Complete rotator cuff tear or rupture of right shoulder, not specified as traumatic: Secondary | ICD-10-CM | POA: Diagnosis not present

## 2020-06-23 DIAGNOSIS — M6281 Muscle weakness (generalized): Secondary | ICD-10-CM | POA: Diagnosis not present

## 2020-06-23 DIAGNOSIS — M25611 Stiffness of right shoulder, not elsewhere classified: Secondary | ICD-10-CM | POA: Diagnosis not present

## 2020-06-25 DIAGNOSIS — M25611 Stiffness of right shoulder, not elsewhere classified: Secondary | ICD-10-CM | POA: Diagnosis not present

## 2020-06-25 DIAGNOSIS — M25511 Pain in right shoulder: Secondary | ICD-10-CM | POA: Diagnosis not present

## 2020-06-25 DIAGNOSIS — M6281 Muscle weakness (generalized): Secondary | ICD-10-CM | POA: Diagnosis not present

## 2020-06-25 DIAGNOSIS — M75121 Complete rotator cuff tear or rupture of right shoulder, not specified as traumatic: Secondary | ICD-10-CM | POA: Diagnosis not present

## 2020-06-30 DIAGNOSIS — M25612 Stiffness of left shoulder, not elsewhere classified: Secondary | ICD-10-CM | POA: Diagnosis not present

## 2020-06-30 DIAGNOSIS — M6281 Muscle weakness (generalized): Secondary | ICD-10-CM | POA: Diagnosis not present

## 2020-06-30 DIAGNOSIS — M75122 Complete rotator cuff tear or rupture of left shoulder, not specified as traumatic: Secondary | ICD-10-CM | POA: Diagnosis not present

## 2020-06-30 DIAGNOSIS — M25512 Pain in left shoulder: Secondary | ICD-10-CM | POA: Diagnosis not present

## 2020-07-07 DIAGNOSIS — M6281 Muscle weakness (generalized): Secondary | ICD-10-CM | POA: Diagnosis not present

## 2020-07-07 DIAGNOSIS — M75122 Complete rotator cuff tear or rupture of left shoulder, not specified as traumatic: Secondary | ICD-10-CM | POA: Diagnosis not present

## 2020-07-07 DIAGNOSIS — M25512 Pain in left shoulder: Secondary | ICD-10-CM | POA: Diagnosis not present

## 2020-07-07 DIAGNOSIS — M25612 Stiffness of left shoulder, not elsewhere classified: Secondary | ICD-10-CM | POA: Diagnosis not present

## 2020-07-09 DIAGNOSIS — M6281 Muscle weakness (generalized): Secondary | ICD-10-CM | POA: Diagnosis not present

## 2020-07-09 DIAGNOSIS — M25612 Stiffness of left shoulder, not elsewhere classified: Secondary | ICD-10-CM | POA: Diagnosis not present

## 2020-07-09 DIAGNOSIS — M75122 Complete rotator cuff tear or rupture of left shoulder, not specified as traumatic: Secondary | ICD-10-CM | POA: Diagnosis not present

## 2020-07-14 DIAGNOSIS — M75122 Complete rotator cuff tear or rupture of left shoulder, not specified as traumatic: Secondary | ICD-10-CM | POA: Diagnosis not present

## 2020-07-14 DIAGNOSIS — M25512 Pain in left shoulder: Secondary | ICD-10-CM | POA: Diagnosis not present

## 2020-07-14 DIAGNOSIS — M25612 Stiffness of left shoulder, not elsewhere classified: Secondary | ICD-10-CM | POA: Diagnosis not present

## 2020-07-14 DIAGNOSIS — M6281 Muscle weakness (generalized): Secondary | ICD-10-CM | POA: Diagnosis not present

## 2020-07-16 DIAGNOSIS — M75122 Complete rotator cuff tear or rupture of left shoulder, not specified as traumatic: Secondary | ICD-10-CM | POA: Diagnosis not present

## 2020-07-16 DIAGNOSIS — M6281 Muscle weakness (generalized): Secondary | ICD-10-CM | POA: Diagnosis not present

## 2020-07-16 DIAGNOSIS — M25612 Stiffness of left shoulder, not elsewhere classified: Secondary | ICD-10-CM | POA: Diagnosis not present

## 2020-07-16 DIAGNOSIS — M25512 Pain in left shoulder: Secondary | ICD-10-CM | POA: Diagnosis not present

## 2020-07-21 DIAGNOSIS — M25612 Stiffness of left shoulder, not elsewhere classified: Secondary | ICD-10-CM | POA: Diagnosis not present

## 2020-07-21 DIAGNOSIS — M25512 Pain in left shoulder: Secondary | ICD-10-CM | POA: Diagnosis not present

## 2020-07-21 DIAGNOSIS — M75122 Complete rotator cuff tear or rupture of left shoulder, not specified as traumatic: Secondary | ICD-10-CM | POA: Diagnosis not present

## 2020-07-21 DIAGNOSIS — M6281 Muscle weakness (generalized): Secondary | ICD-10-CM | POA: Diagnosis not present

## 2020-07-23 DIAGNOSIS — M75122 Complete rotator cuff tear or rupture of left shoulder, not specified as traumatic: Secondary | ICD-10-CM | POA: Diagnosis not present

## 2020-07-23 DIAGNOSIS — M6281 Muscle weakness (generalized): Secondary | ICD-10-CM | POA: Diagnosis not present

## 2020-07-23 DIAGNOSIS — M25512 Pain in left shoulder: Secondary | ICD-10-CM | POA: Diagnosis not present

## 2020-07-23 DIAGNOSIS — M25612 Stiffness of left shoulder, not elsewhere classified: Secondary | ICD-10-CM | POA: Diagnosis not present

## 2020-07-24 DIAGNOSIS — M25522 Pain in left elbow: Secondary | ICD-10-CM | POA: Diagnosis not present

## 2020-07-28 DIAGNOSIS — M25612 Stiffness of left shoulder, not elsewhere classified: Secondary | ICD-10-CM | POA: Diagnosis not present

## 2020-07-28 DIAGNOSIS — M6281 Muscle weakness (generalized): Secondary | ICD-10-CM | POA: Diagnosis not present

## 2020-07-28 DIAGNOSIS — M75122 Complete rotator cuff tear or rupture of left shoulder, not specified as traumatic: Secondary | ICD-10-CM | POA: Diagnosis not present

## 2020-07-28 DIAGNOSIS — M25512 Pain in left shoulder: Secondary | ICD-10-CM | POA: Diagnosis not present

## 2020-07-30 DIAGNOSIS — M25512 Pain in left shoulder: Secondary | ICD-10-CM | POA: Diagnosis not present

## 2020-07-30 DIAGNOSIS — M25612 Stiffness of left shoulder, not elsewhere classified: Secondary | ICD-10-CM | POA: Diagnosis not present

## 2020-07-30 DIAGNOSIS — M75122 Complete rotator cuff tear or rupture of left shoulder, not specified as traumatic: Secondary | ICD-10-CM | POA: Diagnosis not present

## 2020-07-30 DIAGNOSIS — M6281 Muscle weakness (generalized): Secondary | ICD-10-CM | POA: Diagnosis not present

## 2020-08-04 DIAGNOSIS — M25612 Stiffness of left shoulder, not elsewhere classified: Secondary | ICD-10-CM | POA: Diagnosis not present

## 2020-08-04 DIAGNOSIS — M75122 Complete rotator cuff tear or rupture of left shoulder, not specified as traumatic: Secondary | ICD-10-CM | POA: Diagnosis not present

## 2020-08-04 DIAGNOSIS — M6281 Muscle weakness (generalized): Secondary | ICD-10-CM | POA: Diagnosis not present

## 2020-08-04 DIAGNOSIS — M25512 Pain in left shoulder: Secondary | ICD-10-CM | POA: Diagnosis not present

## 2020-08-06 DIAGNOSIS — M25612 Stiffness of left shoulder, not elsewhere classified: Secondary | ICD-10-CM | POA: Diagnosis not present

## 2020-08-06 DIAGNOSIS — M75122 Complete rotator cuff tear or rupture of left shoulder, not specified as traumatic: Secondary | ICD-10-CM | POA: Diagnosis not present

## 2020-08-06 DIAGNOSIS — M25512 Pain in left shoulder: Secondary | ICD-10-CM | POA: Diagnosis not present

## 2020-08-06 DIAGNOSIS — M6281 Muscle weakness (generalized): Secondary | ICD-10-CM | POA: Diagnosis not present

## 2020-08-12 DIAGNOSIS — M75122 Complete rotator cuff tear or rupture of left shoulder, not specified as traumatic: Secondary | ICD-10-CM | POA: Diagnosis not present

## 2020-08-12 DIAGNOSIS — M25512 Pain in left shoulder: Secondary | ICD-10-CM | POA: Diagnosis not present

## 2020-08-12 DIAGNOSIS — M25612 Stiffness of left shoulder, not elsewhere classified: Secondary | ICD-10-CM | POA: Diagnosis not present

## 2020-08-12 DIAGNOSIS — M6281 Muscle weakness (generalized): Secondary | ICD-10-CM | POA: Diagnosis not present

## 2020-08-18 DIAGNOSIS — M25512 Pain in left shoulder: Secondary | ICD-10-CM | POA: Diagnosis not present

## 2020-08-18 DIAGNOSIS — M75122 Complete rotator cuff tear or rupture of left shoulder, not specified as traumatic: Secondary | ICD-10-CM | POA: Diagnosis not present

## 2020-08-18 DIAGNOSIS — M25612 Stiffness of left shoulder, not elsewhere classified: Secondary | ICD-10-CM | POA: Diagnosis not present

## 2020-08-18 DIAGNOSIS — M6281 Muscle weakness (generalized): Secondary | ICD-10-CM | POA: Diagnosis not present

## 2020-08-26 ENCOUNTER — Ambulatory Visit: Payer: BC Managed Care – PPO | Admitting: Gastroenterology

## 2020-09-08 DIAGNOSIS — M25612 Stiffness of left shoulder, not elsewhere classified: Secondary | ICD-10-CM | POA: Diagnosis not present

## 2020-09-08 DIAGNOSIS — M6281 Muscle weakness (generalized): Secondary | ICD-10-CM | POA: Diagnosis not present

## 2020-09-08 DIAGNOSIS — M25512 Pain in left shoulder: Secondary | ICD-10-CM | POA: Diagnosis not present

## 2020-09-08 DIAGNOSIS — M75122 Complete rotator cuff tear or rupture of left shoulder, not specified as traumatic: Secondary | ICD-10-CM | POA: Diagnosis not present

## 2020-09-09 DIAGNOSIS — M25512 Pain in left shoulder: Secondary | ICD-10-CM | POA: Diagnosis not present

## 2020-10-07 DIAGNOSIS — M25512 Pain in left shoulder: Secondary | ICD-10-CM | POA: Diagnosis not present

## 2020-10-07 DIAGNOSIS — H8110 Benign paroxysmal vertigo, unspecified ear: Secondary | ICD-10-CM | POA: Diagnosis not present

## 2020-10-07 DIAGNOSIS — R251 Tremor, unspecified: Secondary | ICD-10-CM | POA: Diagnosis not present

## 2020-10-07 DIAGNOSIS — R42 Dizziness and giddiness: Secondary | ICD-10-CM | POA: Diagnosis not present

## 2020-12-18 DIAGNOSIS — E782 Mixed hyperlipidemia: Secondary | ICD-10-CM | POA: Diagnosis not present

## 2020-12-18 DIAGNOSIS — E559 Vitamin D deficiency, unspecified: Secondary | ICD-10-CM | POA: Diagnosis not present

## 2020-12-18 DIAGNOSIS — N529 Male erectile dysfunction, unspecified: Secondary | ICD-10-CM | POA: Diagnosis not present

## 2020-12-18 DIAGNOSIS — R7303 Prediabetes: Secondary | ICD-10-CM | POA: Diagnosis not present

## 2020-12-23 DIAGNOSIS — K2210 Ulcer of esophagus without bleeding: Secondary | ICD-10-CM | POA: Diagnosis not present

## 2020-12-23 DIAGNOSIS — E559 Vitamin D deficiency, unspecified: Secondary | ICD-10-CM | POA: Diagnosis not present

## 2020-12-23 DIAGNOSIS — N529 Male erectile dysfunction, unspecified: Secondary | ICD-10-CM | POA: Diagnosis not present

## 2020-12-23 DIAGNOSIS — E782 Mixed hyperlipidemia: Secondary | ICD-10-CM | POA: Diagnosis not present

## 2021-05-13 DIAGNOSIS — M62838 Other muscle spasm: Secondary | ICD-10-CM | POA: Diagnosis not present

## 2021-05-13 DIAGNOSIS — S76012A Strain of muscle, fascia and tendon of left hip, initial encounter: Secondary | ICD-10-CM | POA: Diagnosis not present

## 2021-11-11 IMAGING — CT CT ABD-PELV W/ CM
2 of 5 series · 15 of 46 positions shown, 17 images · IV contrast (Omnipaque or Isovue)
Comparison: None.

CLINICAL DATA: Upper abdominal pain since [REDACTED].

EXAM:
CT ABDOMEN AND PELVIS WITH CONTRAST
TECHNIQUE: Multidetector CT imaging of the abdomen and pelvis was performed
using the standard protocol following bolus administration of
intravenous contrast.
CONTRAST:  100mL OMNIPAQUE IOHEXOL 300 MG/ML  SOLN

[Series 2: axial st · axial · 0.66mm/px · z∈[-402,+23]mm · 12 of 97 slices shown, 14 images]
[im 6/97  soft-tissue]
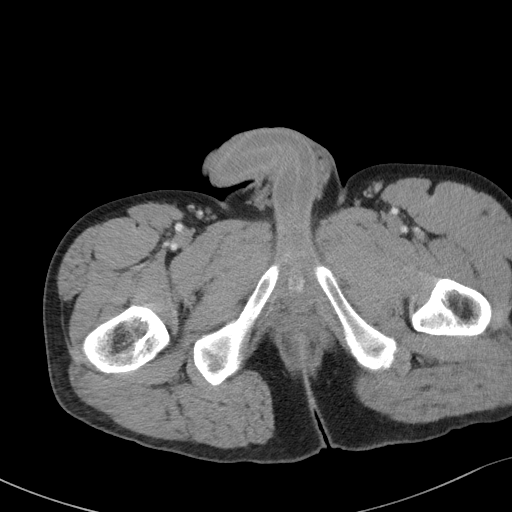
[im 6/97  bone]
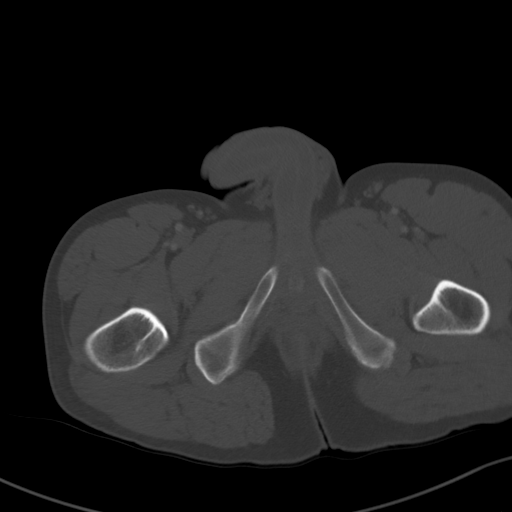
[im 16/97  soft-tissue]
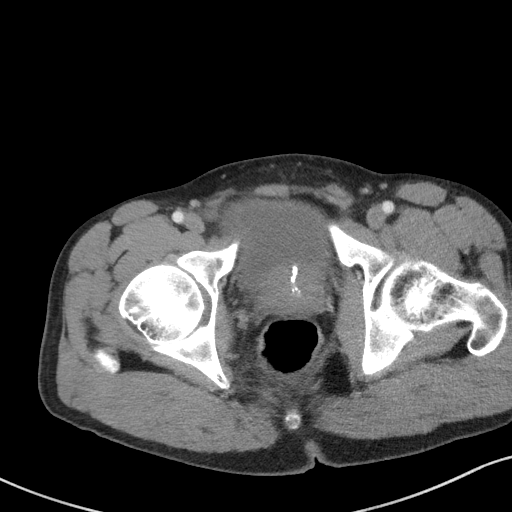
[im 21/97  soft-tissue]
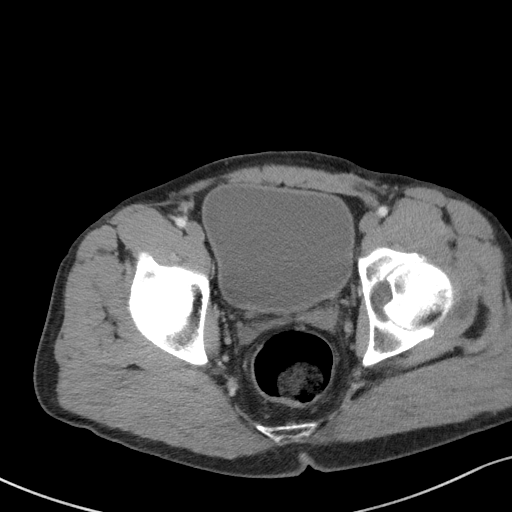
[im 31/97  soft-tissue]
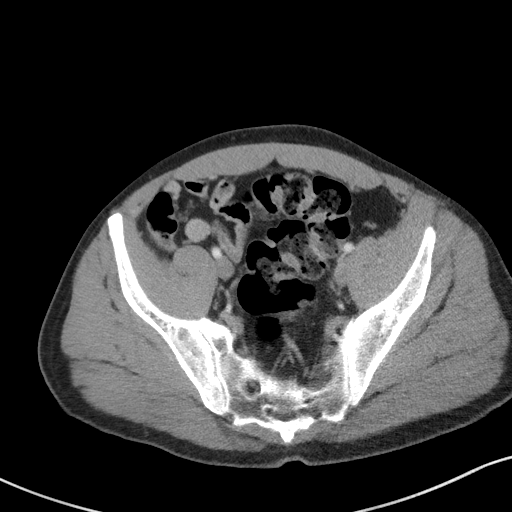
[im 36/97  soft-tissue]
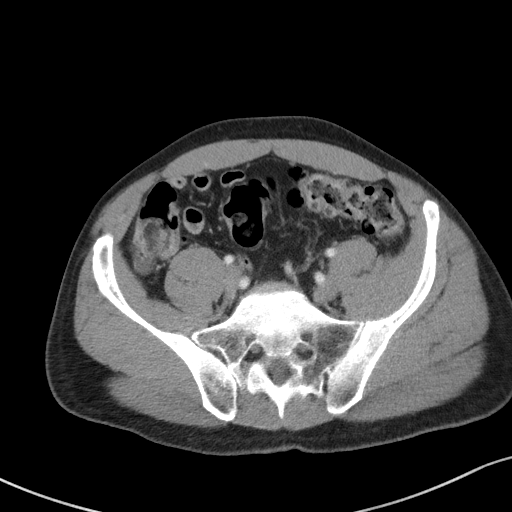
[im 46/97  soft-tissue]
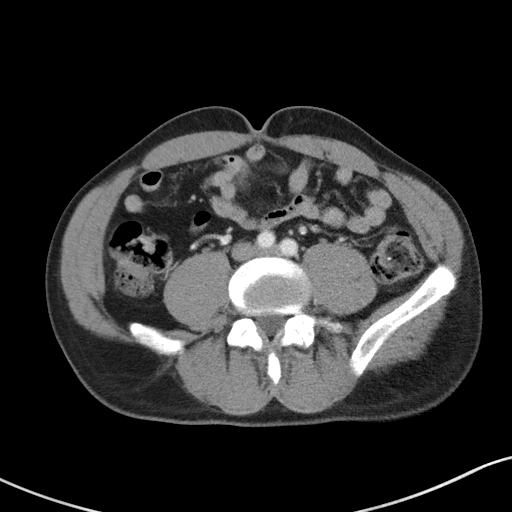
[im 51/97  soft-tissue]
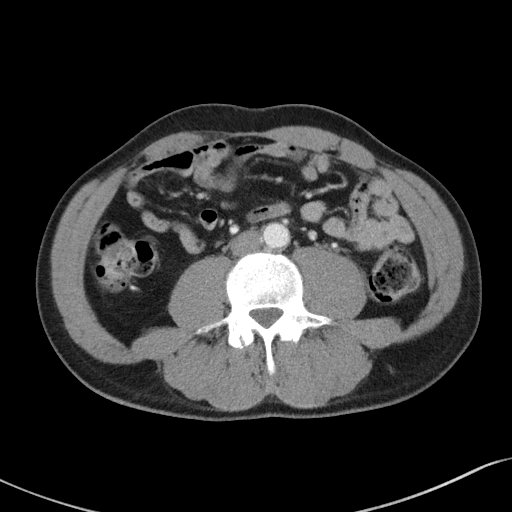
[im 61/97  soft-tissue]
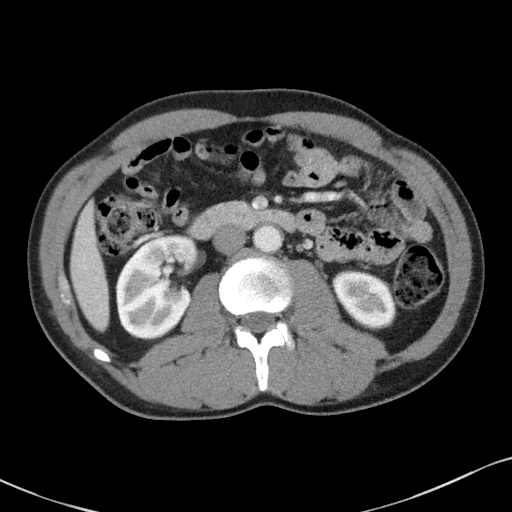
[im 66/97  soft-tissue]
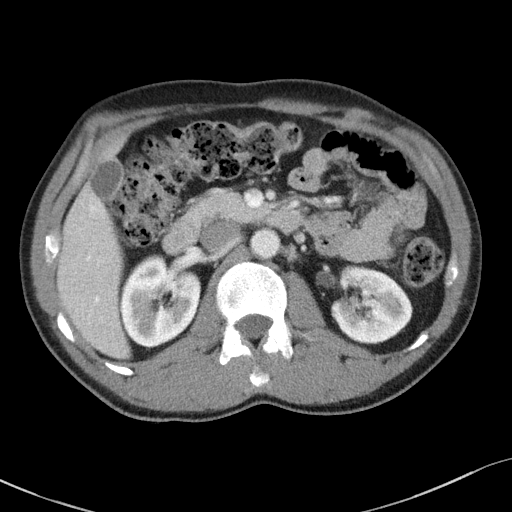
[im 66/97  bone]
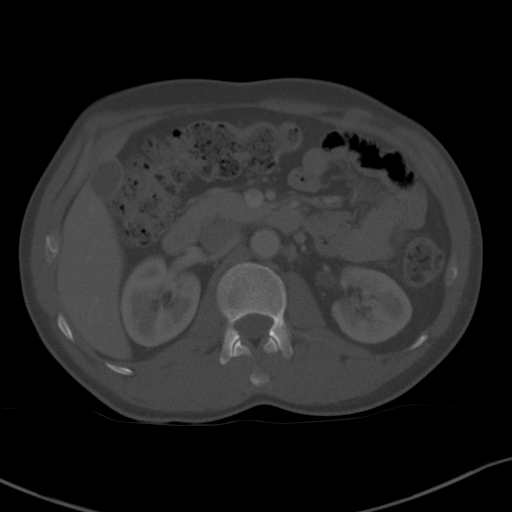
[im 76/97  soft-tissue]
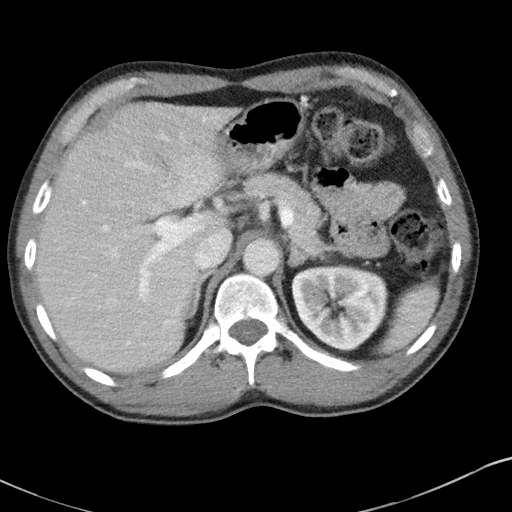
[im 81/97  soft-tissue]
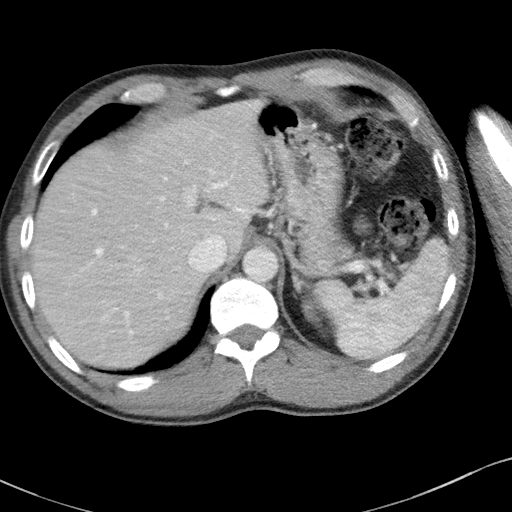
[im 91/97  soft-tissue]
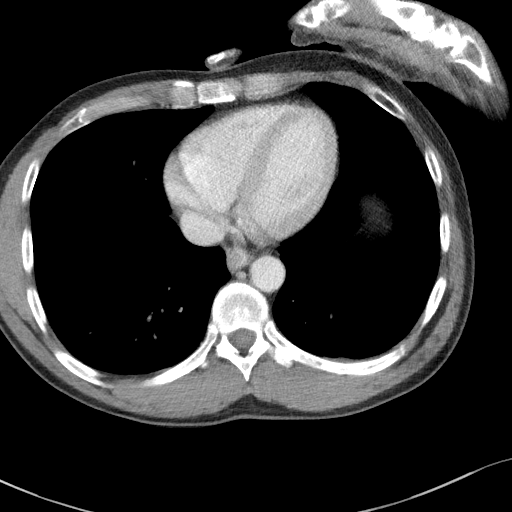

[Series 5: coronal st · coronal · 0.64mm/px · 3 of 78 slices shown]
[im 26/78  soft-tissue]
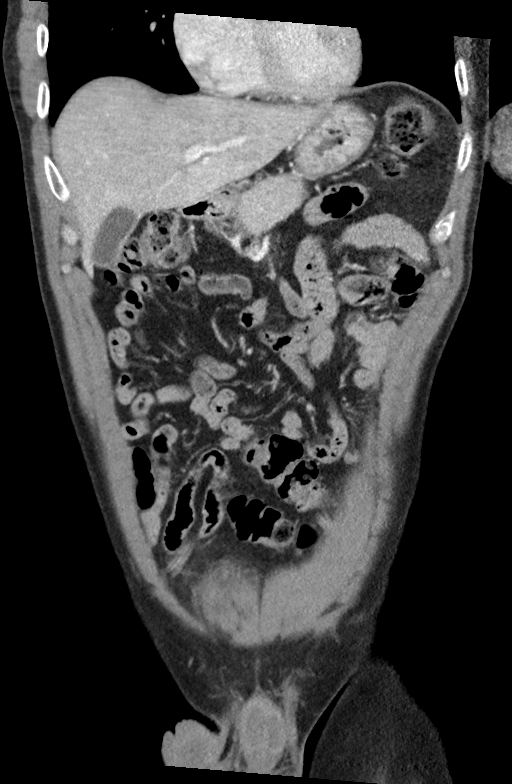
[im 35/78  soft-tissue]
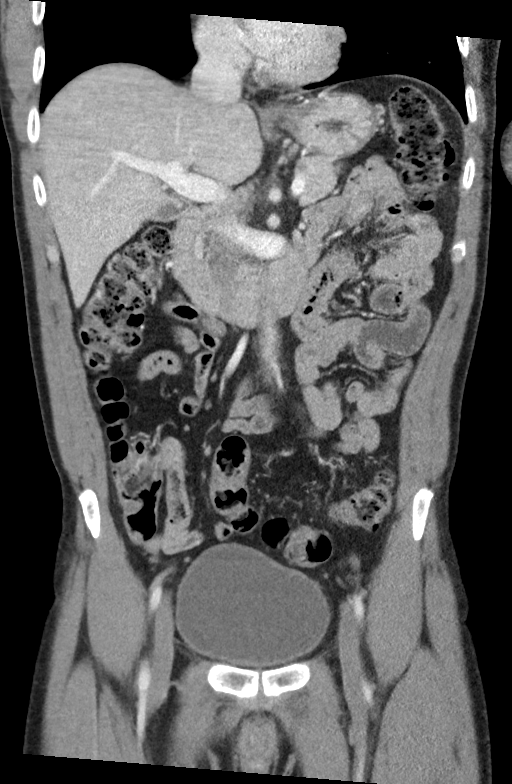
[im 43/78  soft-tissue]
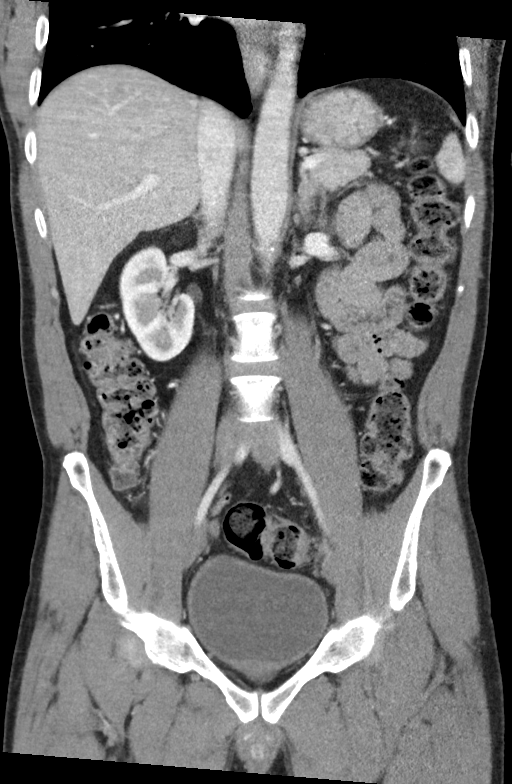

[15 of 46 positions shown; findings below may reference images not displayed]

FINDINGS: Lower chest: The visualized lung bases are clear.

Hepatobiliary: No focal liver abnormality is seen. No gallstones,
gallbladder wall thickening, or biliary dilatation.

Pancreas: Unremarkable.

Spleen: Unremarkable.

Adrenals/Urinary Tract: Unremarkable adrenal glands. Hypodensities
in the kidneys measuring up to 1 cm in size, likely cysts though
most are too small to fully characterize. No renal calculi or
hydronephrosis. Two punctate calculi posterior in the bladder
adjacent to the right UVJ. No ureteral calculi or ureteral
dilatation.

Stomach/Bowel: The stomach is unremarkable. There is a moderate
amount of stool in the colon and rectum. There is no evidence of
bowel obstruction or inflammation. The appendix is unremarkable.

Vascular/Lymphatic: Mild abdominal aortic atherosclerosis without
aneurysm. No enlarged lymph nodes.

Reproductive: Coarse calcification centrally in the prostate.

Other: No ascites or pneumoperitoneum.

Musculoskeletal: No acute osseous abnormality or suspicious osseous
lesion.
IMPRESSION: 1. No acute abnormality identified in the abdomen or pelvis.
2. Punctate bladder calculi.
3. Moderate colonic stool burden.
4. Aortic Atherosclerosis ([id number]).

## 2023-03-14 ENCOUNTER — Other Ambulatory Visit: Payer: Self-pay | Admitting: *Deleted

## 2023-03-14 DIAGNOSIS — R222 Localized swelling, mass and lump, trunk: Secondary | ICD-10-CM

## 2023-03-17 ENCOUNTER — Ambulatory Visit: Payer: BC Managed Care – PPO | Admitting: Surgery

## 2023-03-17 ENCOUNTER — Encounter: Payer: Self-pay | Admitting: Surgery

## 2023-03-17 VITALS — BP 128/81 | HR 86 | Temp 98.6°F | Resp 12 | Ht 71.0 in | Wt 188.0 lb

## 2023-03-17 DIAGNOSIS — D171 Benign lipomatous neoplasm of skin and subcutaneous tissue of trunk: Secondary | ICD-10-CM | POA: Diagnosis not present

## 2023-03-17 NOTE — Progress Notes (Signed)
[facility] History and Physical  Reason for Referral: Back mass Referring Physician: Dr. [name]  Chief Complaint   New Patient (Initial Visit)     Joshua Brennan is a 52 y.o. male.  HPI: Patient presents for evaluation of a right shoulder mass.  He first noticed it a few months ago, and it has started bothering him with moving his right upper extremity.  He will note some swelling associated with the pain.  The patient significant other notes that it is more red after he has been mobile.  He denies any drainage from the area.  Denies fevers and chills.  He denies any history of other lumps or bumps anywhere on his body that have required interventions.  He denies any significant past medical history and denies taking any medications at this time.  He denies use of blood thinning medications.  He will very rarely take Aleve.  His surgical history is significant for a left inguinal hernia repair, left rotator cuff surgery, left elbow surgery, and right knee surgery.  He smokes 1 pack of cigarettes per day and rarely drinks alcohol.  He will smoke marijuana, but denies any other illicit drugs.  Past Medical History:  Diagnosis Date   Anxiety    Dizziness    ED (erectile dysfunction)    GERD (gastroesophageal reflux disease)    Tremor    Vertigo     Past Surgical History:  Procedure Laterality Date   BICEPT TENODESIS Left 05/07/2020   Procedure: BICEPS TENODESIS;  Surgeon: Bjorn Pippin, MD;  Location: [facility];  Service: Orthopedics;  Laterality: Left;   INGUINAL HERNIA REPAIR  1990   KNEE ARTHROSCOPY Right 1987   SHOULDER ARTHROSCOPY WITH ROTATOR CUFF REPAIR AND SUBACROMIAL DECOMPRESSION Left 05/07/2020   Procedure: LEFT SHOULDER ARTHROSCOPY  DEBRIDEMENT WITH ROTATOR CUFF REPAIR AND SUBACROMIAL DECOMPRESSION PARTIAL ACROMIOPLASTY;  Surgeon: Bjorn Pippin, MD;  Location: [facility];  Service: Orthopedics;  Laterality: Left;   ULNAR NERVE  TRANSPOSITION Left 05/07/2020   Procedure: ULNAR NERVE AT ELBOW NEUROPLASTY AND TRANSPOSITION;  Surgeon: Bjorn Pippin, MD;  Location: [facility];  Service: Orthopedics;  Laterality: Left;   UPPER GASTROINTESTINAL ENDOSCOPY      Family History  Problem Relation Age of Onset   Diabetes Mother    Pneumonia Father    Diabetes Maternal Grandmother    Heart attack Maternal Uncle 40   Colon cancer Neg Hx    Esophageal cancer Neg Hx    Rectal cancer Neg Hx    Stomach cancer Neg Hx     Social History   Tobacco Use   Smoking status: Every Day    Current packs/day: 1.00    Types: Cigarettes   Smokeless tobacco: Never  Vaping Use   Vaping status: Never Used  Substance Use Topics   Alcohol use: Not Currently   Drug use: Not Currently    Types: Marijuana    Comment: sometimes    Medications: I have reviewed the patient's current medications. Allergies as of 03/17/2023   No Known Allergies      Medication List        Accurate as of March 17, 2023 11:09 AM. If you have any questions, ask your nurse or doctor.          STOP taking these medications    meclizine 25 MG tablet Commonly known as: ANTIVERT Stopped by: Ilina Xu A Allice Garro   omeprazole 40 MG capsule Commonly known  as: PRILOSEC Stopped by: Santina Evans A Jaana Brodt   ondansetron 8 MG disintegrating tablet Commonly known as: ZOFRAN-ODT Stopped by: Maurissa Ambrose A Jaice Digioia   polyethylene glycol 17 g packet Commonly known as: MiraLax Stopped by: Judson Tsan A Lillard Bailon   sildenafil 50 MG tablet Commonly known as: VIAGRA Stopped by: Tahjir Silveria A Chele Cornell         ROS:  Constitutional: negative for chills, fatigue, and fevers Eyes: negative for visual disturbance and pain Ears, nose, mouth, throat, and face: negative for ear drainage, sore throat, and sinus problems Respiratory: negative for cough, wheezing, and shortness of breath Cardiovascular: negative for chest pain and  palpitations Gastrointestinal: negative for abdominal pain, nausea, reflux symptoms, and vomiting Genitourinary:negative for dysuria and frequency Integument/breast: negative for dryness and rash Hematologic/lymphatic: negative for bleeding and lymphadenopathy Musculoskeletal:positive for back pain and neck pain Neurological: negative for dizziness and tremors Endocrine: negative for temperature intolerance  Blood pressure 128/81, pulse 86, temperature 98.6 F (37 C), temperature source Oral, resp. rate 12, height 5\' 11"  (1.803 m), weight 188 lb (85.3 kg), SpO2 98%. Physical Exam Vitals reviewed.  Constitutional:      Appearance: Normal appearance.  HENT:     Head: Normocephalic and atraumatic.  Eyes:     Extraocular Movements: Extraocular movements intact.     Pupils: Pupils are equal, round, and reactive to light.  Cardiovascular:     Rate and Rhythm: Normal rate and regular rhythm.  Pulmonary:     Effort: Pulmonary effort is normal.     Breath sounds: Normal breath sounds.  Abdominal:     General: There is no distension.     Palpations: Abdomen is soft.     Tenderness: There is no abdominal tenderness.  Musculoskeletal:        General: Normal range of motion.     Cervical back: Normal range of motion.  Skin:    General: Skin is warm and dry.     Comments: 4 cm soft and mobile mass in the right upper back/shoulder, minimally tender to palpation; no overyling erythema or induration  Neurological:     General: No focal deficit present.     Mental Status: He is alert and oriented to person, place, and time.  Psychiatric:        Mood and Affect: Mood normal.        Behavior: Behavior normal.     Results: No results found for this or any previous visit (from the past 48 hour(s)).  No results found.   Assessment & Plan:  Joshua Brennan is a 52 y.o. male who presents for evaluation of an upper back/shoulder mass  -I discussed the pathophysiology of lipomas and why  will remove these areas.  Given his pain associated with this mass, I am recommending excision of the area -The risk and benefits of excision of right upper back/shoulder mass were discussed including but not limited to bleeding, infection, injury to surrounding structures, and need for additional procedures.  After careful consideration, Elaine Basore has decided to proceed with surgery.  -Patient tentatively scheduled for surgery on 8/21 -Information provided to the patient regarding lipomas  All questions were answered to the satisfaction of the patient and family.  Theophilus Kinds, DO [facility] [address] [place], [place] [address] [phone] (office)

## 2023-03-18 NOTE — H&P (Signed)
[facility] History and Physical  Reason for Referral: Back mass Referring Physician: Dr. [name]  Chief Complaint   New Patient (Initial Visit)     Joshua Brennan is a 52 y.o. male.  HPI: Patient presents for evaluation of a right shoulder mass.  He first noticed it a few months ago, and it has started bothering him with moving his right upper extremity.  He will note some swelling associated with the pain.  The patient significant other notes that it is more red after he has been mobile.  He denies any drainage from the area.  Denies fevers and chills.  He denies any history of other lumps or bumps anywhere on his body that have required interventions.  He denies any significant past medical history and denies taking any medications at this time.  He denies use of blood thinning medications.  He will very rarely take Aleve.  His surgical history is significant for a left inguinal hernia repair, left rotator cuff surgery, left elbow surgery, and right knee surgery.  He smokes 1 pack of cigarettes per day and rarely drinks alcohol.  He will smoke marijuana, but denies any other illicit drugs.  Past Medical History:  Diagnosis Date   Anxiety    Dizziness    ED (erectile dysfunction)    GERD (gastroesophageal reflux disease)    Tremor    Vertigo     Past Surgical History:  Procedure Laterality Date   BICEPT TENODESIS Left 05/07/2020   Procedure: BICEPS TENODESIS;  Surgeon: Bjorn Pippin, MD;  Location: [facility];  Service: Orthopedics;  Laterality: Left;   INGUINAL HERNIA REPAIR  1990   KNEE ARTHROSCOPY Right 1987   SHOULDER ARTHROSCOPY WITH ROTATOR CUFF REPAIR AND SUBACROMIAL DECOMPRESSION Left 05/07/2020   Procedure: LEFT SHOULDER ARTHROSCOPY  DEBRIDEMENT WITH ROTATOR CUFF REPAIR AND SUBACROMIAL DECOMPRESSION PARTIAL ACROMIOPLASTY;  Surgeon: Bjorn Pippin, MD;  Location: [facility];  Service: Orthopedics;  Laterality: Left;   ULNAR NERVE  TRANSPOSITION Left 05/07/2020   Procedure: ULNAR NERVE AT ELBOW NEUROPLASTY AND TRANSPOSITION;  Surgeon: Bjorn Pippin, MD;  Location: [facility];  Service: Orthopedics;  Laterality: Left;   UPPER GASTROINTESTINAL ENDOSCOPY      Family History  Problem Relation Age of Onset   Diabetes Mother    Pneumonia Father    Diabetes Maternal Grandmother    Heart attack Maternal Uncle 40   Colon cancer Neg Hx    Esophageal cancer Neg Hx    Rectal cancer Neg Hx    Stomach cancer Neg Hx     Social History   Tobacco Use   Smoking status: Every Day    Current packs/day: 1.00    Types: Cigarettes   Smokeless tobacco: Never  Vaping Use   Vaping status: Never Used  Substance Use Topics   Alcohol use: Not Currently   Drug use: Not Currently    Types: Marijuana    Comment: sometimes    Medications: I have reviewed the patient's current medications. Allergies as of 03/17/2023   No Known Allergies      Medication List        Accurate as of March 17, 2023 11:09 AM. If you have any questions, ask your nurse or doctor.          STOP taking these medications    meclizine 25 MG tablet Commonly known as: ANTIVERT Stopped by: Omari Koslosky A Rayburn Mundis   omeprazole 40 MG capsule Commonly known  as: PRILOSEC Stopped by: Santina Evans A Candelaria Pies   ondansetron 8 MG disintegrating tablet Commonly known as: ZOFRAN-ODT Stopped by: Marcelus Dubberly A Ferguson Gertner   polyethylene glycol 17 g packet Commonly known as: MiraLax Stopped by: Nyemah Watton A Shandrea Lusk   sildenafil 50 MG tablet Commonly known as: VIAGRA Stopped by: Xabi Wittler A Ricka Westra         ROS:  Constitutional: negative for chills, fatigue, and fevers Eyes: negative for visual disturbance and pain Ears, nose, mouth, throat, and face: negative for ear drainage, sore throat, and sinus problems Respiratory: negative for cough, wheezing, and shortness of breath Cardiovascular: negative for chest pain and  palpitations Gastrointestinal: negative for abdominal pain, nausea, reflux symptoms, and vomiting Genitourinary:negative for dysuria and frequency Integument/breast: negative for dryness and rash Hematologic/lymphatic: negative for bleeding and lymphadenopathy Musculoskeletal:positive for back pain and neck pain Neurological: negative for dizziness and tremors Endocrine: negative for temperature intolerance  Blood pressure 128/81, pulse 86, temperature 98.6 F (37 C), temperature source Oral, resp. rate 12, height 5\' 11"  (1.803 m), weight 188 lb (85.3 kg), SpO2 98%. Physical Exam Vitals reviewed.  Constitutional:      Appearance: Normal appearance.  HENT:     Head: Normocephalic and atraumatic.  Eyes:     Extraocular Movements: Extraocular movements intact.     Pupils: Pupils are equal, round, and reactive to light.  Cardiovascular:     Rate and Rhythm: Normal rate and regular rhythm.  Pulmonary:     Effort: Pulmonary effort is normal.     Breath sounds: Normal breath sounds.  Abdominal:     General: There is no distension.     Palpations: Abdomen is soft.     Tenderness: There is no abdominal tenderness.  Musculoskeletal:        General: Normal range of motion.     Cervical back: Normal range of motion.  Skin:    General: Skin is warm and dry.     Comments: 4 cm soft and mobile mass in the right upper back/shoulder, minimally tender to palpation; no overyling erythema or induration  Neurological:     General: No focal deficit present.     Mental Status: He is alert and oriented to person, place, and time.  Psychiatric:        Mood and Affect: Mood normal.        Behavior: Behavior normal.     Results: No results found for this or any previous visit (from the past 48 hour(s)).  No results found.   Assessment & Plan:  Joshua Brennan is a 52 y.o. male who presents for evaluation of an upper back/shoulder mass  -I discussed the pathophysiology of lipomas and why  will remove these areas.  Given his pain associated with this mass, I am recommending excision of the area -The risk and benefits of excision of right upper back/shoulder mass were discussed including but not limited to bleeding, infection, injury to surrounding structures, and need for additional procedures.  After careful consideration, Joshua Brennan has decided to proceed with surgery.  -Patient tentatively scheduled for surgery on 8/21 -Information provided to the patient regarding lipomas  All questions were answered to the satisfaction of the patient and family.  Theophilus Kinds, DO [facility] [address] [place] [place], [place] [address] [phone] (office)

## 2023-03-25 NOTE — Patient Instructions (Addendum)
Joshua Brennan  03/25/2023     @PREFPERIOPPHARMACY @   Your procedure is scheduled on  03/30/2023.   Report to [facility] at  0840  A.M.   Call this number if you have problems the morning of surgery:  [phone]  If you experience any cold or flu symptoms such as cough, fever, chills, shortness of breath, etc. between now and your scheduled surgery, please notify us at the above number.   Remember:  Do not eat or drink after midnight.      Take these medicines the morning of surgery with A SIP OF WATER                                        None.    Do not wear jewelry, make-up or nail polish, including gel polish,  artificial nails, or any other type of covering on natural nails (fingers and  toes).  Do not wear lotions, powders, or perfumes, or deodorant.  Do not shave 48 hours prior to surgery.  Men may shave face and neck.  Do not bring valuables to the hospital.  [facility] is not responsible for any belongings or valuables.  Contacts, dentures or bridgework may not be worn into surgery.  Leave your suitcase in the car.  After surgery it may be brought to your room.  For patients admitted to the hospital, discharge time will be determined by your treatment team.  Patients discharged the day of surgery will not be allowed to drive home and must have someone with them for 24 hours.    Special instructions:   DO NOT smoke tobacco or vape for 24 hours before your procedure.  Please read over the following fact sheets that you were given. Pain Booklet, Coughing and Deep Breathing, Surgical Site Infection Prevention, Anesthesia Post-op Instructions, and Care and Recovery After Surgery       Lipoma Removal, Care After The following information offers guidance on how to care for yourself after your procedure. Your health care provider may also give you more specific instructions. If you have problems or questions, contact your health care provider. What  can I expect after the procedure? After the procedure, it is common to have: Mild pain. Swelling. Bruising. Follow these instructions at home: Bathing  Do not take baths, swim, or use a hot tub until your health care provider approves. Ask your health care provider if you may take showers. You may only be allowed to take sponge baths. Keep your bandage (dressing) clean and dry until your health care provider says it can be removed. Incision care  Follow instructions from your health care provider about how to take care of your incision. Make sure you: Wash your hands with soap and water for at least 20 seconds before and after you change your dressing. If soap and water are not available, use hand sanitizer. Change your dressing as told by your health care provider. Leave stitches (sutures), skin glue, or adhesive strips in place. These skin closures may need to stay in place for 2 weeks or longer. If adhesive strip edges start to loosen and curl up, you may trim the loose edges. Do not remove adhesive strips completely unless your health care provider tells you to do that. Check your incision area every day for signs of infection. Check for: More redness, swelling,  or pain. Fluid or blood. Warmth. Pus or a bad smell. Medicines Take over-the-counter and prescription medicines only as told by your health care provider. If you were prescribed an antibiotic medicine, use it as told by your health care provider. Do not stop using the antibiotic even if you start to feel better. General instructions  If you were given a sedative during the procedure, it can affect you for several hours. Do not drive or operate machinery until your health care provider says that it is safe. Do not use any products that contain nicotine or tobacco before the procedure. These products include cigarettes, chewing tobacco, and vaping devices, such as e-cigarettes. These can delay healing after surgery. If you need  help quitting, ask your health care provider. Return to your normal activities as told by your health care provider. Ask your health care provider what activities are safe for you. Keep all follow-up visits. This is important. Contact a health care provider if: You have more redness, swelling, or pain around your incision. You have fluid or blood coming from your incision. Your incision feels warm to the touch. You have pus or a bad smell coming from your incision. You have pain that does not get better with medicine. Get help right away if: You have chills or a fever. You have severe pain. Summary After the procedure, it is common to have mild pain, swelling, and bruising. Follow instructions from your health care provider about how to take care of your incision. Contact a health care provider if you have signs of infection such as more redness, swelling, or pain. This information is not intended to replace advice given to you by your health care provider. Make sure you discuss any questions you have with your health care provider. Document Revised: 08/14/2021 Document Reviewed: 08/14/2021 Elsevier Patient Education  2024 [employer] General Anesthesia, Adult, Care After The following information offers guidance on how to care for yourself after your procedure. Your health care provider may also give you more specific instructions. If you have problems or questions, contact your health care provider. What can I expect after the procedure? After the procedure, it is common for people to: Have pain or discomfort at the IV site. Have nausea or vomiting. Have a sore throat or hoarseness. Have trouble concentrating. Feel cold or chills. Feel weak, sleepy, or tired (fatigue). Have soreness and body aches. These can affect parts of the body that were not involved in surgery. Follow these instructions at home: For the time period you were told by your health care provider:  Rest. Do not  participate in activities where you could fall or become injured. Do not drive or use machinery. Do not drink alcohol. Do not take sleeping pills or medicines that cause drowsiness. Do not make important decisions or sign legal documents. Do not take care of children on your own. General instructions Drink enough fluid to keep your urine pale yellow. If you have sleep apnea, surgery and certain medicines can increase your risk for breathing problems. Follow instructions from your health care provider about wearing your sleep device: Anytime you are sleeping, including during daytime naps. While taking prescription pain medicines, sleeping medicines, or medicines that make you drowsy. Return to your normal activities as told by your health care provider. Ask your health care provider what activities are safe for you. Take over-the-counter and prescription medicines only as told by your health care provider. Do not use any products that contain nicotine or tobacco.  These products include cigarettes, chewing tobacco, and vaping devices, such as e-cigarettes. These can delay incision healing after surgery. If you need help quitting, ask your health care provider. Contact a health care provider if: You have nausea or vomiting that does not get better with medicine. You vomit every time you eat or drink. You have pain that does not get better with medicine. You cannot urinate or have bloody urine. You develop a skin rash. You have a fever. Get help right away if: You have trouble breathing. You have chest pain. You vomit blood. These symptoms may be an emergency. Get help right away. Call 911. Do not wait to see if the symptoms will go away. Do not drive yourself to the hospital. Summary After the procedure, it is common to have a sore throat, hoarseness, nausea, vomiting, or to feel weak, sleepy, or fatigue. For the time period you were told by your health care provider, do not drive or use  machinery. Get help right away if you have difficulty breathing, have chest pain, or vomit blood. These symptoms may be an emergency. This information is not intended to replace advice given to you by your health care provider. Make sure you discuss any questions you have with your health care provider. Document Revised: 10/23/2021 Document Reviewed: 10/23/2021 Elsevier Patient Education  2024 [employer] Inc. How to Use Chlorhexidine Before Surgery Chlorhexidine gluconate (CHG) is a germ-killing (antiseptic) solution that is used to clean the skin. It can get rid of the bacteria that normally live on the skin and can keep them away for about 24 hours. To clean your skin with CHG, you may be given: A CHG solution to use in the shower or as part of a sponge bath. A prepackaged cloth that contains CHG. Cleaning your skin with CHG may help lower the risk for infection: While you are staying in the intensive care unit of the hospital. If you have a vascular access, such as a central line, to provide short-term or long-term access to your veins. If you have a catheter to drain urine from your bladder. If you are on a ventilator. A ventilator is a machine that helps you breathe by moving air in and out of your lungs. After surgery. What are the risks? Risks of using CHG include: A skin reaction. Hearing loss, if CHG gets in your ears and you have a perforated eardrum. Eye injury, if CHG gets in your eyes and is not rinsed out. The CHG product catching fire. Make sure that you avoid smoking and flames after applying CHG to your skin. Do not use CHG: If you have a chlorhexidine allergy or have previously reacted to chlorhexidine. On babies younger than 68 months of age. How to use CHG solution Use CHG only as told by your health care provider, and follow the instructions on the label. Use the full amount of CHG as directed. Usually, this is one bottle. During a shower Follow these steps when using  CHG solution during a shower (unless your health care provider gives you different instructions): Start the shower. Use your normal soap and shampoo to wash your face and hair. Turn off the shower or move out of the shower stream. Pour the CHG onto a clean washcloth. Do not use any type of brush or rough-edged sponge. Starting at your neck, lather your body down to your toes. Make sure you follow these instructions: If you will be having surgery, pay special attention to the part of your  body where you will be having surgery. Scrub this area for at least 1 minute. Do not use CHG on your head or face. If the solution gets into your ears or eyes, rinse them well with water. Avoid your genital area. Avoid any areas of skin that have broken skin, cuts, or scrapes. Scrub your back and under your arms. Make sure to wash skin folds. Let the lather sit on your skin for 1-2 minutes or as long as told by your health care provider. Thoroughly rinse your entire body in the shower. Make sure that all body creases and crevices are rinsed well. Dry off with a clean towel. Do not put any substances on your body afterward--such as powder, lotion, or perfume--unless you are told to do so by your health care provider. Only use lotions that are recommended by the manufacturer. Put on clean clothes or pajamas. If it is the night before your surgery, sleep in clean sheets.  During a sponge bath Follow these steps when using CHG solution during a sponge bath (unless your health care provider gives you different instructions): Use your normal soap and shampoo to wash your face and hair. Pour the CHG onto a clean washcloth. Starting at your neck, lather your body down to your toes. Make sure you follow these instructions: If you will be having surgery, pay special attention to the part of your body where you will be having surgery. Scrub this area for at least 1 minute. Do not use CHG on your head or face. If the  solution gets into your ears or eyes, rinse them well with water. Avoid your genital area. Avoid any areas of skin that have broken skin, cuts, or scrapes. Scrub your back and under your arms. Make sure to wash skin folds. Let the lather sit on your skin for 1-2 minutes or as long as told by your health care provider. Using a different clean, wet washcloth, thoroughly rinse your entire body. Make sure that all body creases and crevices are rinsed well. Dry off with a clean towel. Do not put any substances on your body afterward--such as powder, lotion, or perfume--unless you are told to do so by your health care provider. Only use lotions that are recommended by the manufacturer. Put on clean clothes or pajamas. If it is the night before your surgery, sleep in clean sheets. How to use CHG prepackaged cloths Only use CHG cloths as told by your health care provider, and follow the instructions on the label. Use the CHG cloth on clean, dry skin. Do not use the CHG cloth on your head or face unless your health care provider tells you to. When washing with the CHG cloth: Avoid your genital area. Avoid any areas of skin that have broken skin, cuts, or scrapes. Before surgery Follow these steps when using a CHG cloth to clean before surgery (unless your health care provider gives you different instructions): Using the CHG cloth, vigorously scrub the part of your body where you will be having surgery. Scrub using a back-and-forth motion for 3 minutes. The area on your body should be completely wet with CHG when you are done scrubbing. Do not rinse. Discard the cloth and let the area air-dry. Do not put any substances on the area afterward, such as powder, lotion, or perfume. Put on clean clothes or pajamas. If it is the night before your surgery, sleep in clean sheets.  For general bathing Follow these steps when using CHG cloths for  general bathing (unless your health care provider gives you  different instructions). Use a separate CHG cloth for each area of your body. Make sure you wash between any folds of skin and between your fingers and toes. Wash your body in the following order, switching to a new cloth after each step: The front of your neck, shoulders, and chest. Both of your arms, under your arms, and your hands. Your stomach and groin area, avoiding the genitals. Your right leg and foot. Your left leg and foot. The back of your neck, your back, and your buttocks. Do not rinse. Discard the cloth and let the area air-dry. Do not put any substances on your body afterward--such as powder, lotion, or perfume--unless you are told to do so by your health care provider. Only use lotions that are recommended by the manufacturer. Put on clean clothes or pajamas. Contact a health care provider if: Your skin gets irritated after scrubbing. You have questions about using your solution or cloth. You swallow any chlorhexidine. Call your local poison control center (580-515-6347 in the [place] Get help right away if: Your eyes itch badly, or they become very red or swollen. Your skin itches badly and is red or swollen. Your hearing changes. You have trouble seeing. You have swelling or tingling in your mouth or throat. You have trouble breathing. These symptoms may represent a serious problem that is an emergency. Do not wait to see if the symptoms will go away. Get medical help right away. Call your local emergency services (911 in the [place] Do not drive yourself to the hospital. Summary Chlorhexidine gluconate (CHG) is a germ-killing (antiseptic) solution that is used to clean the skin. Cleaning your skin with CHG may help to lower your risk for infection. You may be given CHG to use for bathing. It may be in a bottle or in a prepackaged cloth to use on your skin. Carefully follow your health care provider's instructions and the instructions on the product label. Do not use CHG if  you have a chlorhexidine allergy. Contact your health care provider if your skin gets irritated after scrubbing. This information is not intended to replace advice given to you by your health care provider. Make sure you discuss any questions you have with your health care provider. Document Revised: 11/23/2021 Document Reviewed: 10/06/2020 Elsevier Patient Education  2023 [employer].

## 2023-03-28 ENCOUNTER — Encounter (HOSPITAL_COMMUNITY)
Admission: RE | Admit: 2023-03-28 | Discharge: 2023-03-28 | Disposition: A | Discharge status: Home / Self Care | Payer: BC Managed Care – PPO | Source: Ambulatory Visit | Attending: Surgery | Admitting: Surgery

## 2023-03-28 ENCOUNTER — Encounter (HOSPITAL_COMMUNITY): Payer: Self-pay

## 2023-03-28 VITALS — BP 128/81 | HR 86 | Temp 98.6°F | Resp 18 | Ht 71.0 in | Wt 188.0 lb

## 2023-03-28 DIAGNOSIS — F17200 Nicotine dependence, unspecified, uncomplicated: Secondary | ICD-10-CM | POA: Insufficient documentation

## 2023-03-28 DIAGNOSIS — R222 Localized swelling, mass and lump, trunk: Secondary | ICD-10-CM | POA: Diagnosis present

## 2023-03-28 DIAGNOSIS — D171 Benign lipomatous neoplasm of skin and subcutaneous tissue of trunk: Secondary | ICD-10-CM | POA: Diagnosis not present

## 2023-03-28 DIAGNOSIS — F17210 Nicotine dependence, cigarettes, uncomplicated: Secondary | ICD-10-CM | POA: Diagnosis not present

## 2023-03-28 DIAGNOSIS — Z01810 Encounter for preprocedural cardiovascular examination: Secondary | ICD-10-CM | POA: Insufficient documentation

## 2023-03-29 ENCOUNTER — Telehealth: Payer: Self-pay | Admitting: *Deleted

## 2023-03-29 NOTE — Telephone Encounter (Signed)
Patient scheduled for excision of lipoma on the upper back on 03/30/2023 with Dr. Theophilus Kinds. Dr. Robyne Peers unable to keep schedule due to personal reasons. Dr. Franky Macho agreeable to covering procedure.   Call placed to patient and patient made aware. Agreeable to change in surgeon.

## 2023-03-30 ENCOUNTER — Ambulatory Visit (HOSPITAL_COMMUNITY): Payer: BC Managed Care – PPO | Admitting: Anesthesiology

## 2023-03-30 ENCOUNTER — Encounter (HOSPITAL_COMMUNITY)
Admission: RE | Disposition: A | Discharge status: Home / Self Care | Payer: Self-pay | Source: Ambulatory Visit | Attending: General Surgery

## 2023-03-30 ENCOUNTER — Encounter (HOSPITAL_COMMUNITY): Payer: Self-pay | Admitting: General Surgery

## 2023-03-30 ENCOUNTER — Ambulatory Visit (HOSPITAL_COMMUNITY)
Admission: RE | Admit: 2023-03-30 | Discharge: 2023-03-30 | Disposition: A | Discharge status: Home / Self Care | Payer: BC Managed Care – PPO | Source: Ambulatory Visit | Attending: General Surgery | Admitting: General Surgery

## 2023-03-30 DIAGNOSIS — F17210 Nicotine dependence, cigarettes, uncomplicated: Secondary | ICD-10-CM | POA: Insufficient documentation

## 2023-03-30 DIAGNOSIS — D171 Benign lipomatous neoplasm of skin and subcutaneous tissue of trunk: Secondary | ICD-10-CM | POA: Diagnosis not present

## 2023-03-30 DIAGNOSIS — R222 Localized swelling, mass and lump, trunk: Secondary | ICD-10-CM | POA: Diagnosis present

## 2023-03-30 HISTORY — PX: LIPOMA EXCISION: SHX5283

## 2023-03-30 SURGERY — EXCISION LIPOMA
Anesthesia: General | Site: Back | Laterality: Right

## 2023-03-30 MED ORDER — FENTANYL CITRATE (PF) 100 MCG/2ML IJ SOLN
INTRAMUSCULAR | Status: AC
  Filled 2023-03-30: qty 2

## 2023-03-30 MED ORDER — BUPIVACAINE HCL (PF) 0.5 % IJ SOLN
INTRAMUSCULAR | Status: DC | PRN
  Administered 2023-03-30: 10 mL

## 2023-03-30 MED ORDER — PROPOFOL 10 MG/ML IV BOLUS
INTRAVENOUS | Status: DC | PRN
  Administered 2023-03-30: 175 mg via INTRAVENOUS

## 2023-03-30 MED ORDER — MIDAZOLAM HCL 2 MG/2ML IJ SOLN
INTRAMUSCULAR | Status: AC
  Filled 2023-03-30: qty 2

## 2023-03-30 MED ORDER — CHLORHEXIDINE GLUCONATE CLOTH 2 % EX PADS
6.0000 | MEDICATED_PAD | Freq: Once | CUTANEOUS | Status: AC
  Administered 2023-03-30: 6 via TOPICAL

## 2023-03-30 MED ORDER — FENTANYL CITRATE (PF) 100 MCG/2ML IJ SOLN
INTRAMUSCULAR | Status: DC | PRN
  Administered 2023-03-30: 50 ug via INTRAVENOUS

## 2023-03-30 MED ORDER — OXYCODONE HCL 5 MG PO TABS: 5.0000 mg | ORAL_TABLET | ORAL | 0 refills | Status: AC | PRN

## 2023-03-30 MED ORDER — POVIDONE-IODINE 10 % EX OINT
TOPICAL_OINTMENT | CUTANEOUS | Status: AC
  Filled 2023-03-30: qty 28.4

## 2023-03-30 MED ORDER — LIDOCAINE HCL (PF) 2 % IJ SOLN
INTRAMUSCULAR | Status: AC
  Filled 2023-03-30: qty 5

## 2023-03-30 MED ORDER — 0.9 % SODIUM CHLORIDE (POUR BTL) OPTIME
TOPICAL | Status: DC | PRN
  Administered 2023-03-30: 1000 mL

## 2023-03-30 MED ORDER — LIDOCAINE HCL (CARDIAC) PF 50 MG/5ML IV SOSY
PREFILLED_SYRINGE | INTRAVENOUS | Status: DC | PRN
  Administered 2023-03-30: 8 mg via INTRAVENOUS

## 2023-03-30 MED ORDER — ONDANSETRON HCL 4 MG/2ML IJ SOLN
INTRAMUSCULAR | Status: DC | PRN
Start: 2023-03-30 — End: 2023-03-30
  Administered 2023-03-30: 4 mg via INTRAVENOUS

## 2023-03-30 MED ORDER — PROPOFOL 10 MG/ML IV BOLUS
INTRAVENOUS | Status: AC
  Filled 2023-03-30: qty 20

## 2023-03-30 MED ORDER — KETOROLAC TROMETHAMINE 30 MG/ML IJ SOLN: 30.0000 mg | Freq: Once | INTRAMUSCULAR | Status: DC

## 2023-03-30 MED ORDER — MIDAZOLAM HCL 5 MG/5ML IJ SOLN
INTRAMUSCULAR | Status: DC | PRN
  Administered 2023-03-30: 2 mg via INTRAVENOUS

## 2023-03-30 MED ORDER — BUPIVACAINE HCL (PF) 0.5 % IJ SOLN
INTRAMUSCULAR | Status: AC
  Filled 2023-03-30: qty 30

## 2023-03-30 MED ORDER — LACTATED RINGERS IV SOLN
INTRAVENOUS | Status: DC | PRN
Start: 2023-03-30 — End: 2023-03-30

## 2023-03-30 SURGICAL SUPPLY — 26 items
ADH SKN CLS APL DERMABOND .7 (GAUZE/BANDAGES/DRESSINGS) ×1
APL PRP STRL LF DISP 70% ISPRP (MISCELLANEOUS) ×1
CHLORAPREP W/TINT 26 (MISCELLANEOUS) ×1 IMPLANT
CLOTH BEACON ORANGE TIMEOUT ST (SAFETY) ×1 IMPLANT
COVER LIGHT HANDLE STERIS (MISCELLANEOUS) ×2 IMPLANT
DERMABOND ADVANCED .7 DNX12 (GAUZE/BANDAGES/DRESSINGS) IMPLANT
ELECT REM PT RETURN 9FT ADLT (ELECTROSURGICAL) ×1
ELECTRODE REM PT RTRN 9FT ADLT (ELECTROSURGICAL) ×1 IMPLANT
GLOVE BIO SURGEON STRL SZ7 (GLOVE) IMPLANT
GLOVE BIOGEL PI IND STRL 7.0 (GLOVE) ×2 IMPLANT
GLOVE SURG SS PI 7.5 STRL IVOR (GLOVE) ×2 IMPLANT
GOWN STRL REUS W/TWL LRG LVL3 (GOWN DISPOSABLE) ×2 IMPLANT
KIT TURNOVER KIT A (KITS) ×1 IMPLANT
MANIFOLD NEPTUNE II (INSTRUMENTS) ×1 IMPLANT
NDL HYPO 25X1 1.5 SAFETY (NEEDLE) ×1 IMPLANT
NEEDLE HYPO 25X1 1.5 SAFETY (NEEDLE) ×1
NS IRRIG 1000ML POUR BTL (IV SOLUTION) ×1 IMPLANT
PACK MINOR (CUSTOM PROCEDURE TRAY) IMPLANT
PAD ARMBOARD 7.5X6 YLW CONV (MISCELLANEOUS) ×1 IMPLANT
PAD TELFA 3X4 1S STER (GAUZE/BANDAGES/DRESSINGS) IMPLANT
POSITIONER HEAD 8X9X4 ADT (SOFTGOODS) ×1 IMPLANT
SET BASIN LINEN APH (SET/KITS/TRAYS/PACK) ×1 IMPLANT
SUT MNCRL AB 4-0 PS2 18 (SUTURE) IMPLANT
SUT VIC AB 3-0 SH 27 (SUTURE) ×1
SUT VIC AB 3-0 SH 27X BRD (SUTURE) IMPLANT
SYR CONTROL 10ML LL (SYRINGE) ×1 IMPLANT

## 2023-03-30 NOTE — Transfer of Care (Signed)
Immediate Anesthesia Transfer of Care Note  Patient: Joshua Brennan  Procedure(s) Performed: EXCISION LIPOMA, UPPER BACK/ SHOULDER (Right: Back)  Patient Location: PACU  Anesthesia Type:General  Level of Consciousness: awake  Airway & Oxygen Therapy: Patient Spontanous Breathing  Post-op Assessment: Report given to RN  Post vital signs: Reviewed and stable  Last Vitals:  Vitals Value Taken Time  BP 110/79 03/30/23 1030  Temp    Pulse 70 03/30/23 1034  Resp 15 03/30/23 1034  SpO2 98 % 03/30/23 1034  Vitals shown include unfiled device data.  Last Pain:  Vitals:   03/30/23 0758  TempSrc: Oral  PainSc: 0-No pain         Complications: No notable events documented.

## 2023-03-30 NOTE — Anesthesia Preprocedure Evaluation (Signed)
Anesthesia Evaluation  Patient identified by MRN, date of birth, ID band Patient awake    Reviewed: Allergy & Precautions, H&P , NPO status , Patient's Chart, lab work & pertinent test results, reviewed documented beta blocker date and time   Airway Mallampati: II  TM Distance: >3 FB Neck ROM: full    Dental no notable dental hx.    Pulmonary neg pulmonary ROS, Current Smoker and Patient abstained from smoking.   Pulmonary exam normal breath sounds clear to auscultation       Cardiovascular Exercise Tolerance: Good negative cardio ROS  Rhythm:regular Rate:Normal     Neuro/Psych  PSYCHIATRIC DISORDERS Anxiety Depression     Neuromuscular disease negative neurological ROS  negative psych ROS   GI/Hepatic negative GI ROS, Neg liver ROS,GERD  ,,  Endo/Other  negative endocrine ROS    Renal/GU negative Renal ROS  negative genitourinary   Musculoskeletal   Abdominal   Peds  Hematology negative hematology ROS (+)   Anesthesia Other Findings   Reproductive/Obstetrics negative OB ROS                             Anesthesia Physical Anesthesia Plan  ASA: 2  Anesthesia Plan: General   Post-op Pain Management:    Induction:   PONV Risk Score and Plan: Ondansetron  Airway Management Planned:   Additional Equipment:   Intra-op Plan:   Post-operative Plan:   Informed Consent: I have reviewed the patients History and Physical, chart, labs and discussed the procedure including the risks, benefits and alternatives for the proposed anesthesia with the patient or authorized representative who has indicated his/her understanding and acceptance.     Dental Advisory Given  Plan Discussed with: CRNA  Anesthesia Plan Comments:        Anesthesia Quick Evaluation

## 2023-03-30 NOTE — Anesthesia Procedure Notes (Signed)
Procedure Name: LMA Insertion Date/Time: 03/30/2023 9:41 AM  Performed by: Moshe Salisbury, CRNAPre-anesthesia Checklist: Patient identified, Patient being monitored, Emergency Drugs available, Timeout performed and Suction available Patient Re-evaluated:Patient Re-evaluated prior to induction Oxygen Delivery Method: Circle System Utilized Preoxygenation: Pre-oxygenation with 100% oxygen Induction Type: IV induction Ventilation: Mask ventilation without difficulty LMA: LMA inserted LMA Size: 4.0 Number of attempts: 1 Placement Confirmation: positive ETCO2 and breath sounds checked- equal and bilateral

## 2023-03-30 NOTE — Op Note (Signed)
Patient:  Joshua Brennan  DOB:  June 07, 1971  MRN:  [id number]   Preop Diagnosis: Soft tissue mass, back  Postop Diagnosis: Same, lipoma  Procedure: Excision of lipoma, back  Surgeon: Franky Macho, MD  Anes: General  Indications: Patient is a 52 year old black male who presents with a soft tissue mass in the upper right back.  The risks and benefits of the procedure including bleeding, infection, and recurrence of the mass were fully explained to the patient, who gave informed consent.  Procedure note: The patient was placed in the left lateral decubitus position after general anesthesia was administered.  The right upper back was prepped and draped using usual sterile technique with ChloraPrep.  Surgical site confirmation was performed.  A transverse incision was made over the mass.  The dissection was taken down to the subcutaneous tissue.  The lipoma was found.  It was approximately 3-1/2 cm in its greatest diameter.  Care was taken to avoid cautery.  This did not involve the muscle layer.  The specimen was sent to pathology further examination.  The subcutaneous layer was reapproximated using a 3-0 Vicryl interrupted suture.  0.5% Sensorcaine was instilled into the surrounding wound.  The skin was closed using a 4-0 Monocryl subcuticular suture.  Dermabond was applied.  All tape and needle counts were correct at the end of the procedure.  The patient was awakened and transferred to PACU in stable condition.  Complications: None  EBL: Minimal  Specimen: Lipoma, back

## 2023-03-30 NOTE — Interval H&P Note (Signed)
History and Physical Interval Note:  03/30/2023 9:22 AM  Joshua Brennan  has presented today for surgery, with the diagnosis of LIPOMA, UPPER BACK/ SHOULDER, RIGHT, 4 CM.  The various methods of treatment have been discussed with the patient and family. After consideration of risks, benefits and other options for treatment, the patient has consented to  Procedure(s) with comments: EXCISION LIPOMA, UPPER BACK/ SHOULDER (Right) - pt knows to arrive at 7:30 as a surgical intervention.  The patient's history has been reviewed, patient examined, no change in status, stable for surgery.  I have reviewed the patient's chart and labs.  Questions were answered to the patient's satisfaction.     Franky Macho

## 2023-03-31 ENCOUNTER — Telehealth: Payer: Self-pay | Admitting: *Deleted

## 2023-03-31 LAB — SURGICAL PATHOLOGY

## 2023-03-31 NOTE — Telephone Encounter (Signed)
Received STD forms from [employer] [phone]~ telephone [phone]~ fax  Initial OV: 03/17/2023~ Theophilus Kinds, DO  Surgical Date:03/30/2023~ Franky Macho, MD Procedure: Excision Lipoma, Upper Back/ Shoulder, Right CPT: 23071  Dx: D17.1/ Z61.09

## 2023-03-31 NOTE — Anesthesia Postprocedure Evaluation (Signed)
Anesthesia Post Note  Patient: Joshua Brennan  Procedure(s) Performed: EXCISION LIPOMA, UPPER BACK/ SHOULDER (Right: Back)  Patient location during evaluation: Phase II Anesthesia Type: General Level of consciousness: awake Pain management: pain level controlled Vital Signs Assessment: post-procedure vital signs reviewed and stable Respiratory status: spontaneous breathing and respiratory function stable Cardiovascular status: blood pressure returned to baseline and stable Postop Assessment: no headache and no apparent nausea or vomiting Anesthetic complications: no Comments: Late entry   No notable events documented.   Last Vitals:  Vitals:   03/30/23 1045 03/30/23 1100  BP: 126/82 123/79  Pulse: 77 70  Resp: 12 18  Temp:  37.1 C  SpO2: 98% 98%    Last Pain:  Vitals:   03/30/23 1100  TempSrc: Oral  PainSc: 0-No pain                 Windell Norfolk

## 2023-04-08 ENCOUNTER — Ambulatory Visit: Payer: BC Managed Care – PPO | Admitting: General Surgery

## 2023-04-08 ENCOUNTER — Encounter (HOSPITAL_COMMUNITY): Payer: Self-pay | Admitting: General Surgery

## 2023-04-08 DIAGNOSIS — Z09 Encounter for follow-up examination after completed treatment for conditions other than malignant neoplasm: Secondary | ICD-10-CM

## 2023-04-08 DIAGNOSIS — D171 Benign lipomatous neoplasm of skin and subcutaneous tissue of trunk: Secondary | ICD-10-CM

## 2023-04-08 NOTE — Progress Notes (Signed)
Subjective:     Joshua Brennan  Postoperative telephone visit performed with patient.  I was in the hospital and the patient was at home.  He states he has some mild swelling of the area where the lipoma was removed.  He is otherwise doing well. Objective:    There were no vitals taken for this visit.  General:  cooperative  Final pathology reveals a lipoma.     Assessment:    Doing well postoperatively.    Plan:   Patient would like me to look at the wound.  I will see him on 04/14/2023.  Total telephone time was 2-1/2 minutes.  As this was a part of the total global surgical fee, this was not a billable visit.

## 2023-04-13 NOTE — Telephone Encounter (Signed)
Forms faxed on 04/08/2023 to [employer] at [phone]. Confirmation received.   Patient out of work 03/30/2023 and may return to work unrestricted on 05/04/2023.

## 2023-04-14 ENCOUNTER — Encounter: Payer: Self-pay | Admitting: General Surgery

## 2023-04-14 ENCOUNTER — Encounter: Payer: Self-pay | Admitting: Family Medicine

## 2023-04-14 ENCOUNTER — Ambulatory Visit (INDEPENDENT_AMBULATORY_CARE_PROVIDER_SITE_OTHER): Payer: BC Managed Care – PPO | Admitting: General Surgery

## 2023-04-14 VITALS — BP 139/79 | HR 63 | Temp 98.4°F | Resp 12 | Ht 71.0 in | Wt 190.0 lb

## 2023-04-14 DIAGNOSIS — Z09 Encounter for follow-up examination after completed treatment for conditions other than malignant neoplasm: Secondary | ICD-10-CM

## 2023-04-14 NOTE — Progress Notes (Signed)
Subjective:     Joshua Brennan  Patient here for postoperative visit, status post excision of lipoma on his back.  He is doing well.  He has no complaints. Objective:    BP 139/79   Pulse 63   Temp 98.4 F (36.9 C) (Oral)   Resp 12   Ht 5\' 11"  (1.803 m)   Wt 190 lb (86.2 kg)   SpO2 99%   BMI 26.50 kg/m   General:  alert, cooperative, and no distress  Incision healing well.  No seroma or hematoma present.  Final pathology showed a lipoma.     Assessment:    Doing well postoperatively.    Plan:   May resume normal activity.  Follow-up here as needed.
